# Patient Record
Sex: Male | Born: 1980 | Race: White | Hispanic: No | Marital: Married | Smoking: Current every day smoker
Health system: Southern US, Community
[De-identification: ages and names within clinical notes are randomized; demographics above are authoritative.]

## PROBLEM LIST (undated history)

## (undated) DIAGNOSIS — F1910 Other psychoactive substance abuse, uncomplicated: Secondary | ICD-10-CM

## (undated) DIAGNOSIS — Z9889 Other specified postprocedural states: Secondary | ICD-10-CM

## (undated) HISTORY — PX: TONSILLECTOMY: SUR1361

## (undated) HISTORY — PX: CORONARY ANGIOPLASTY WITH STENT PLACEMENT: SHX49

## (undated) HISTORY — PX: CHEST TUBE INSERTION: SHX231

---

## 2005-08-02 ENCOUNTER — Emergency Department: Payer: Self-pay | Admitting: Emergency Medicine

## 2005-08-31 ENCOUNTER — Emergency Department: Payer: Self-pay | Admitting: Emergency Medicine

## 2006-09-26 ENCOUNTER — Emergency Department: Payer: Self-pay | Admitting: Internal Medicine

## 2006-10-05 ENCOUNTER — Emergency Department: Payer: Self-pay | Admitting: Emergency Medicine

## 2007-08-30 ENCOUNTER — Emergency Department: Payer: Self-pay | Admitting: Emergency Medicine

## 2008-05-07 ENCOUNTER — Emergency Department: Payer: Self-pay | Admitting: Emergency Medicine

## 2011-01-14 ENCOUNTER — Emergency Department: Payer: Self-pay | Admitting: Emergency Medicine

## 2011-07-13 ENCOUNTER — Emergency Department: Payer: Self-pay | Admitting: *Deleted

## 2012-11-28 ENCOUNTER — Emergency Department: Payer: Self-pay | Admitting: Emergency Medicine

## 2014-06-03 ENCOUNTER — Emergency Department: Payer: Self-pay | Admitting: Emergency Medicine

## 2014-06-03 LAB — COMPREHENSIVE METABOLIC PANEL
ALK PHOS: 76 U/L
ALT: 104 U/L — AB (ref 12–78)
Albumin: 4.3 g/dL (ref 3.4–5.0)
Anion Gap: 4 — ABNORMAL LOW (ref 7–16)
BUN: 17 mg/dL (ref 7–18)
Bilirubin,Total: 0.4 mg/dL (ref 0.2–1.0)
CHLORIDE: 107 mmol/L (ref 98–107)
Calcium, Total: 8.6 mg/dL (ref 8.5–10.1)
Co2: 29 mmol/L (ref 21–32)
Creatinine: 1.25 mg/dL (ref 0.60–1.30)
EGFR (Non-African Amer.): >60
GLUCOSE: 97 mg/dL (ref 65–99)
OSMOLALITY: 281 (ref 275–301)
Potassium: 4.2 mmol/L (ref 3.5–5.1)
SGOT(AST): 56 U/L — ABNORMAL HIGH (ref 15–37)
Sodium: 140 mmol/L (ref 136–145)
TOTAL PROTEIN: 7.7 g/dL (ref 6.4–8.2)

## 2014-06-03 LAB — URINALYSIS, COMPLETE
Bacteria: NONE SEEN
Bilirubin,UR: NEGATIVE
Blood: NEGATIVE
GLUCOSE, UR: NEGATIVE mg/dL (ref 0–75)
Ketone: NEGATIVE
Leukocyte Esterase: NEGATIVE
Nitrite: NEGATIVE
PH: 5 (ref 4.5–8.0)
Protein: 30
RBC,UR: 4 /HPF (ref 0–5)
SPECIFIC GRAVITY: 1.028 (ref 1.003–1.030)
SQUAMOUS EPITHELIAL: NONE SEEN
WBC UR: 3 /HPF (ref 0–5)

## 2014-06-03 LAB — CBC
HCT: 46.4 % (ref 40.0–52.0)
HGB: 15.8 g/dL (ref 13.0–18.0)
MCH: 32.7 pg (ref 26.0–34.0)
MCHC: 34.1 g/dL (ref 32.0–36.0)
MCV: 96 fL (ref 80–100)
Platelet: 185 10*3/uL (ref 150–440)
RBC: 4.84 10*6/uL (ref 4.40–5.90)
RDW: 13.7 % (ref 11.5–14.5)
WBC: 6.3 10*3/uL (ref 3.8–10.6)

## 2014-06-03 LAB — DRUG SCREEN, URINE
Amphetamines, Ur Screen: NEGATIVE (ref ?–1000)
BENZODIAZEPINE, UR SCRN: POSITIVE (ref ?–200)
Barbiturates, Ur Screen: NEGATIVE (ref ?–200)
COCAINE METABOLITE, UR ~~LOC~~: POSITIVE (ref ?–300)
Cannabinoid 50 Ng, Ur ~~LOC~~: POSITIVE (ref ?–50)
MDMA (ECSTASY) UR SCREEN: NEGATIVE (ref ?–500)
Methadone, Ur Screen: NEGATIVE (ref ?–300)
Opiate, Ur Screen: NEGATIVE (ref ?–300)
Phencyclidine (PCP) Ur S: NEGATIVE (ref ?–25)
Tricyclic, Ur Screen: NEGATIVE (ref ?–1000)

## 2014-06-03 LAB — SALICYLATE LEVEL: SALICYLATES, SERUM: 2.6 mg/dL

## 2014-06-03 LAB — ETHANOL: Ethanol %: <0.003 % (ref 0.000–0.080)

## 2014-06-03 LAB — ACETAMINOPHEN LEVEL: Acetaminophen: <2 ug/mL

## 2014-07-16 ENCOUNTER — Emergency Department: Payer: Self-pay | Admitting: Student

## 2014-07-16 LAB — COMPREHENSIVE METABOLIC PANEL
ALK PHOS: 82 U/L
Albumin: 4 g/dL (ref 3.4–5.0)
Anion Gap: 7 (ref 7–16)
BUN: 10 mg/dL (ref 7–18)
Bilirubin,Total: 0.6 mg/dL (ref 0.2–1.0)
CALCIUM: 8.5 mg/dL (ref 8.5–10.1)
CHLORIDE: 105 mmol/L (ref 98–107)
Co2: 29 mmol/L (ref 21–32)
Creatinine: 0.9 mg/dL (ref 0.60–1.30)
EGFR (African American): >60
EGFR (Non-African Amer.): >60
GLUCOSE: 84 mg/dL (ref 65–99)
OSMOLALITY: 279 (ref 275–301)
Potassium: 3.8 mmol/L (ref 3.5–5.1)
SGOT(AST): 60 U/L — ABNORMAL HIGH (ref 15–37)
SGPT (ALT): 100 U/L — ABNORMAL HIGH
Sodium: 141 mmol/L (ref 136–145)
TOTAL PROTEIN: 7.4 g/dL (ref 6.4–8.2)

## 2014-07-16 LAB — CBC
HCT: 44.1 % (ref 40.0–52.0)
HGB: 15.5 g/dL (ref 13.0–18.0)
MCH: 33.3 pg (ref 26.0–34.0)
MCHC: 35.2 g/dL (ref 32.0–36.0)
MCV: 95 fL (ref 80–100)
PLATELETS: 161 10*3/uL (ref 150–440)
RBC: 4.66 10*6/uL (ref 4.40–5.90)
RDW: 12.9 % (ref 11.5–14.5)
WBC: 7.5 10*3/uL (ref 3.8–10.6)

## 2014-07-16 LAB — URINALYSIS, COMPLETE
Bacteria: NONE SEEN
Bilirubin,UR: NEGATIVE
Blood: NEGATIVE
Glucose,UR: NEGATIVE mg/dL (ref 0–75)
LEUKOCYTE ESTERASE: NEGATIVE
NITRITE: NEGATIVE
Ph: 5 (ref 4.5–8.0)
Protein: NEGATIVE
RBC,UR: 1 /HPF (ref 0–5)
Specific Gravity: 1.027 (ref 1.003–1.030)
Squamous Epithelial: NONE SEEN

## 2014-07-16 LAB — DRUG SCREEN, URINE
AMPHETAMINES, UR SCREEN: NEGATIVE (ref ?–1000)
BARBITURATES, UR SCREEN: NEGATIVE (ref ?–200)
BENZODIAZEPINE, UR SCRN: POSITIVE (ref ?–200)
Cannabinoid 50 Ng, Ur ~~LOC~~: POSITIVE (ref ?–50)
Cocaine Metabolite,Ur ~~LOC~~: POSITIVE (ref ?–300)
MDMA (Ecstasy)Ur Screen: NEGATIVE (ref ?–500)
Methadone, Ur Screen: NEGATIVE (ref ?–300)
OPIATE, UR SCREEN: POSITIVE (ref ?–300)
Phencyclidine (PCP) Ur S: NEGATIVE (ref ?–25)
Tricyclic, Ur Screen: NEGATIVE (ref ?–1000)

## 2014-07-16 LAB — SALICYLATE LEVEL: SALICYLATES, SERUM: 3.2 mg/dL — AB

## 2014-07-16 LAB — ACETAMINOPHEN LEVEL: Acetaminophen: <2 ug/mL

## 2014-07-16 LAB — ETHANOL

## 2016-01-21 ENCOUNTER — Emergency Department
Admission: EM | Admit: 2016-01-21 | Discharge: 2016-01-21 | Disposition: A | Discharge status: Home / Self Care | Payer: Self-pay

## 2016-01-22 ENCOUNTER — Emergency Department
Admission: EM | Admit: 2016-01-22 | Discharge: 2016-01-22 | Disposition: A | Discharge status: Home / Self Care | Payer: Self-pay | Attending: Emergency Medicine | Admitting: Emergency Medicine

## 2016-01-22 ENCOUNTER — Emergency Department: Payer: Self-pay

## 2016-01-22 DIAGNOSIS — R200 Anesthesia of skin: Secondary | ICD-10-CM | POA: Insufficient documentation

## 2016-01-22 DIAGNOSIS — F1410 Cocaine abuse, uncomplicated: Secondary | ICD-10-CM | POA: Insufficient documentation

## 2016-01-22 DIAGNOSIS — M25512 Pain in left shoulder: Secondary | ICD-10-CM | POA: Insufficient documentation

## 2016-01-22 DIAGNOSIS — F1210 Cannabis abuse, uncomplicated: Secondary | ICD-10-CM | POA: Insufficient documentation

## 2016-01-22 DIAGNOSIS — F17210 Nicotine dependence, cigarettes, uncomplicated: Secondary | ICD-10-CM | POA: Insufficient documentation

## 2016-01-22 DIAGNOSIS — F1010 Alcohol abuse, uncomplicated: Secondary | ICD-10-CM | POA: Insufficient documentation

## 2016-01-22 DIAGNOSIS — F1110 Opioid abuse, uncomplicated: Secondary | ICD-10-CM | POA: Insufficient documentation

## 2016-01-22 DIAGNOSIS — F1910 Other psychoactive substance abuse, uncomplicated: Secondary | ICD-10-CM

## 2016-01-22 DIAGNOSIS — R079 Chest pain, unspecified: Secondary | ICD-10-CM | POA: Insufficient documentation

## 2016-01-22 LAB — BASIC METABOLIC PANEL
ANION GAP: 9 (ref 5–15)
BUN: 14 mg/dL (ref 6–20)
CHLORIDE: 103 mmol/L (ref 101–111)
CO2: 25 mmol/L (ref 22–32)
Calcium: 9.3 mg/dL (ref 8.9–10.3)
Creatinine, Ser: 0.83 mg/dL (ref 0.61–1.24)
GFR calc non Af Amer: >60 mL/min (ref >60)
Glucose, Bld: 114 mg/dL — ABNORMAL HIGH (ref 65–99)
POTASSIUM: 3.6 mmol/L (ref 3.5–5.1)
Sodium: 137 mmol/L (ref 135–145)

## 2016-01-22 LAB — CBC
HEMATOCRIT: 40 % (ref 40.0–52.0)
HEMOGLOBIN: 14.2 g/dL (ref 13.0–18.0)
MCH: 32 pg (ref 26.0–34.0)
MCHC: 35.4 g/dL (ref 32.0–36.0)
MCV: 90.3 fL (ref 80.0–100.0)
Platelets: 161 10*3/uL (ref 150–440)
RBC: 4.44 MIL/uL (ref 4.40–5.90)
RDW: 12.8 % (ref 11.5–14.5)
WBC: 9.4 10*3/uL (ref 3.8–10.6)

## 2016-01-22 LAB — TROPONIN I: Troponin I: <0.03 ng/mL (ref <0.031)

## 2016-01-22 LAB — FIBRIN DERIVATIVES D-DIMER (ARMC ONLY): Fibrin derivatives D-dimer (ARMC): 253 (ref 0–499)

## 2016-01-22 MED ORDER — DIAZEPAM 5 MG PO TABS
5.0000 mg | ORAL_TABLET | Freq: Once | ORAL | Status: AC
Start: 2016-01-22 — End: 2016-01-22
  Administered 2016-01-22: 5 mg via ORAL
  Filled 2016-01-22: qty 1

## 2016-01-22 MED ORDER — SODIUM CHLORIDE 0.9 % IV BOLUS (SEPSIS)
1000.0000 mL | Freq: Once | INTRAVENOUS | Status: AC
  Administered 2016-01-22: 1000 mL via INTRAVENOUS

## 2016-01-22 MED ORDER — KETOROLAC TROMETHAMINE 30 MG/ML IJ SOLN
30.0000 mg | Freq: Once | INTRAMUSCULAR | Status: AC
  Administered 2016-01-22: 30 mg via INTRAVENOUS
  Filled 2016-01-22: qty 1

## 2016-01-22 NOTE — ED Notes (Signed)
MD Quigley at bedside. 

## 2016-01-22 NOTE — ED Notes (Signed)
Pt requested meal tray. MD Huel CoteQuigley notified. MD approved meal. Food/water brought to pt.

## 2016-01-22 NOTE — ED Notes (Signed)
Pt O2 sats decreased to 87. Placed pt on 2L [place]. Pt currently O2 sat of 97 on 2L.

## 2016-01-22 NOTE — ED Notes (Signed)
Res[facility] care from [facility]. Pt requests more pain medication and removal of IV. Told pt we will ask the MD for more medication and asked that he bear with us a bit longer on the IV until we are sure he is medically cleared. He agreed. Pt states he would like to go directly from here to rehab.

## 2016-01-22 NOTE — ED Notes (Signed)
Pt called [facility] and there is a spot. They will pick him up upon discharge

## 2016-01-22 NOTE — Discharge Instructions (Signed)
Polysubstance Abuse °When people abuse more than one drug or type of drug it is called polysubstance or polydrug abuse. For example, many smokers also drink alcohol. This is one form of polydrug abuse. Polydrug abuse also refers to the use of a drug to counteract an unpleasant effect produced by another drug. It may also be used to help with withdrawal from another drug. People who take stimulants may become agitated. Sometimes this agitation is countered with a tranquilizer. This helps protect against the unpleasant side effects. Polydrug abuse also refers to the use of different drugs at the same time.  °Anytime drug use is interfering with normal living activities, it has become abuse. This includes problems with family and friends. Psychological dependence has developed when your mind tells you that the drug is needed. This is usually followed by physical dependence which has developed when continuing increases of drug are required to get the same feeling or "high". This is known as addiction or chemical dependency. A person's risk is much higher if there is a history of chemical dependency in the family. °SIGNS OF CHEMICAL DEPENDENCY °· You have been told by friends or family that drugs have become a problem. °· You fight when using drugs. °· You are having blackouts (not remembering what you do while using). °· You feel sick from using drugs but continue using. °· You lie about use or amounts of drugs (chemicals) used. °· You need chemicals to get you going. °· You are suffering in work performance or in school because of drug use. °· You get sick from use of drugs but continue to use anyway. °· You need drugs to relate to people or feel comfortable in social situations. °· You use drugs to forget problems. °"Yes" answered to any of the above signs of chemical dependency indicates there are problems. The longer the use of drugs continues, the greater the problems will become. °If there is a family history of  drug or alcohol use, it is best not to experiment with these drugs. Continual use leads to tolerance. After tolerance develops more of the drug is needed to get the same feeling. This is followed by addiction. With addiction, drugs become the most important part of life. It becomes more important to take drugs than participate in the other usual activities of life. This includes relating to friends and family. Addiction is followed by dependency. Dependency is a condition where drugs are now needed not just to get high, but to feel normal. °Addiction cannot be cured but it can be stopped. This often requires outside help and the care of professionals. Treatment centers are listed in the yellow pages under: Cocaine, Narcotics, and Alcoholics Anonymous. Most hospitals and clinics can refer you to a specialized care center. Talk to your caregiver if you need help. °  °This information is not intended to replace advice given to you by your health care provider. Make sure you discuss any questions you have with your health care provider. °  °Document Released: 06/29/2005 Document Revised: 01/30/2012 Document Reviewed: 11/12/2014 °Elsevier Interactive Patient Education ©2016 [employer] ° °

## 2016-01-22 NOTE — ED Notes (Signed)
Pt discharged home after verbalizing understanding of discharge instructions; nad noted. 

## 2016-01-22 NOTE — B[facility] Counselor (Addendum)
With patient's consent, faxed referral information to [facility] for detox services.  [facility] will provide pt with resources for detox as [facility] not provide these services.

## 2016-01-22 NOTE — ED Notes (Signed)
Pt states: "I haven't been doing nothing but drugs for the last 3 days". Pt reports he injected heroine into both AC's tonight. Pt reports he lives with his girlfriend, but she is currently in rehab.

## 2016-01-22 NOTE — ED Provider Notes (Signed)
Time Seen: Approximately 0 500  I have reviewed the triage notes  Chief Complaint: Chest Pain   History of Present Illness: Gary Miranda is a 35 y.o. male who presents with acute onset of left-sided chest discomfort with some left shoulder pain. Patient admits to polysubstance abuse and states he injected himself with some heroin in both arms earlier this morning. He states he was on the floor for an unknown period of time and woke up with the left-sided chest discomfort and numbness. He denies any exacerbating or relieving factors for his chest pain. Patient admits to cocaine usage yesterday. He also admits to alcohol consumption on a daily basis He also periodically use Percocet, marijuana, etc. He does smoke cigarettes. No history of diabetes, hypertension, high cholesterol. History reviewed. No pertinent past medical history.  There are no active problems to display for this patient.   History reviewed. No pertinent past surgical history.  History reviewed. No pertinent past surgical history.  No current outpatient prescriptions on file.  Allergies:  Review of patient's allergies indicates no known allergies.  Family History: History reviewed. No pertinent family history. He states his father had heart disease at a later age than himself Social History: Social History  Substance Use Topics  . Smoking status: Current Every Day Smoker -- 1.00 packs/day  . Smokeless tobacco: None  . Alcohol Use: Yes     Comment: Pt states "I drink at least a fifth every other day"     Review of Systems:   10 point review of systems was performed and was otherwise negative:  Constitutional: No fever Eyes: No visual disturbances ENT: No sore throat, ear pain Cardiac: Chest pain is constant left-sided toward the sternal area and up in the left upper chest region he describes it as a sharp discomfort Respiratory: No shortness of breath, wheezing, or stridor Abdomen: No abdominal pain, no  vomiting, No diarrhea Endocrine: No weight loss, No night sweats Extremities: No peripheral edema, cyanosis Skin: No rashes, easy bruising Neurologic: No focal weakness, trouble with speech or swollowing Urologic: No dysuria, Hematuria, or urinary frequency   Physical Exam:  ED Triage Vitals  Enc Vitals Group     BP 01/22/16 0436 126/80 mmHg     Pulse Rate 01/22/16 0436 70     Resp 01/22/16 0436 24     Temp 01/22/16 0436 98.1 F (36.7 C)     Temp Source 01/22/16 0436 Oral     SpO2 01/22/16 0428 100 %     Weight 01/22/16 0436 145 lb (65.772 kg)     Height 01/22/16 0436  (1.676 m)     Head Cir --      Peak Flow --      Pain Score 01/22/16 0437 4     Pain Loc --      Pain Edu? --      Excl. in GC? --     General: Awake , Alert , and Oriented times 3; GCS 15 Head: Normal cephalic , atraumatic Eyes: Pupils equal , round, reactive to light Nose/Throat: No nasal drainage, patent upper airway without erythema or exudate.  Neck: Supple, Full range of motion, No anterior adenopathy or palpable thyroid masses Lungs: Clear to ascultation without wheezes , rhonchi, or rales Heart: Regular rate, regular rhythm without murmurs , gallops , or rubs Abdomen: Soft, non tender without rebound, guarding , or rigidity; bowel sounds positive and symmetric in all 4 quadrants. No organomegaly .  Extremities: 2 plus symmetric pulses. No edema, clubbing or cyanosis Neurologic: normal ambulation, Motor symmetric without deficits, sensory intact Skin: warm, dry, no rashes   Labs:   All laboratory work was reviewed including any pertinent negatives or positives listed below:  Labs Reviewed  BASIC METABOLIC PANEL  CBC  TROPONIN I  FIBRIN DERIVATIVES D-DIMER ([facility] ONLY)   review the patient's laboratory work shows no significant findings  EKG:  ED ECG REPORT I, Jennye MoccasinBrian S Lenah Messenger, the attending physician, personally viewed and interpreted this ECG.  Date: 01/22/2016 EKG Time:  0447 Rate: 66 Rhythm: normal sinus rhythm QRS Axis: normal Intervals: normal ST/T Wave abnormalities: normal Conduction Disturbances: none Narrative Interpretation: unremarkable Normal EKG  Radiology:     dizziness and nausea.  EXAM: CHEST 2 VIEW  COMPARISON: Chest radiograph June 03, 2014  FINDINGS: Cardiomediastinal silhouette is normal. Linear density RIGHT upper lobe. The lungs are clear without pleural effusions or focal consolidations. Trachea projects midline and there is no pneumothorax. Soft tissue planes and included osseous structures are non-suspicious.  IMPRESSION: RIGHT upper lobe atelectasis/scarring.   Electronically Signed By: Awilda Metroourtnay Bloomer M.D. On: 01/22/2016 05:32  I personally reviewed the radiologic studies     ED Course:  Patient's stay here was uneventful and remains hemodynamically stable. Not sure of the source of his chest pain but I don't suspect is from a life-threatening etiology. Patient appears stable and is requesting drug and alcohol treatment program. Patient's currently medically cleared and I have added a urine drug screen and alcohol levels. Consult has been established with [facility]    Assessment:  Acute unspecified chest pain Polysubstance abuse     Plan:  TTS consultation           Jennye MoccasinBrian S Batoul Limes, MD 01/22/16 [phone]

## 2016-01-22 NOTE — ED Notes (Signed)
Pt reports hx of IV cocaine and heroine use. Pt reports taking IV cocaine today and having left chest pains immediately afterwards rated at a 6 out of 10. Pt c/o of dizziness, nausea. Pt reports he would like help detoxing.

## 2016-01-22 NOTE — ED Provider Notes (Signed)
Patient is no acute distress, medically clear for detox.  Gary FilbertJonathan E Williams, MD 01/22/16 [phone]

## 2017-11-04 ENCOUNTER — Other Ambulatory Visit: Payer: Self-pay

## 2017-11-04 ENCOUNTER — Encounter: Payer: Self-pay | Admitting: Emergency Medicine

## 2017-11-04 ENCOUNTER — Emergency Department
Admission: EM | Admit: 2017-11-04 | Discharge: 2017-11-04 | Disposition: A | Discharge status: Home / Self Care | Payer: Self-pay | Attending: Emergency Medicine | Admitting: Emergency Medicine

## 2017-11-04 DIAGNOSIS — K0889 Other specified disorders of teeth and supporting structures: Secondary | ICD-10-CM | POA: Insufficient documentation

## 2017-11-04 DIAGNOSIS — F17210 Nicotine dependence, cigarettes, uncomplicated: Secondary | ICD-10-CM | POA: Insufficient documentation

## 2017-11-04 MED ORDER — IBUPROFEN 800 MG PO TABS: 800.0000 mg | ORAL_TABLET | Freq: Three times a day (TID) | ORAL | 0 refills | Status: AC | PRN

## 2017-11-04 MED ORDER — TRAMADOL HCL 50 MG PO TABS
50.0000 mg | ORAL_TABLET | Freq: Once | ORAL | Status: AC
  Administered 2017-11-04: 50 mg via ORAL
  Filled 2017-11-04: qty 1

## 2017-11-04 MED ORDER — IBUPROFEN 800 MG PO TABS
800.0000 mg | ORAL_TABLET | Freq: Once | ORAL | Status: AC
  Administered 2017-11-04: 800 mg via ORAL
  Filled 2017-11-04: qty 1

## 2017-11-04 MED ORDER — TRAMADOL HCL 50 MG PO TABS: 50.0000 mg | ORAL_TABLET | Freq: Four times a day (QID) | ORAL | 0 refills | Status: DC | PRN

## 2017-11-04 MED ORDER — CLINDAMYCIN HCL 150 MG PO CAPS: 150.0000 mg | ORAL_CAPSULE | Freq: Four times a day (QID) | ORAL | 0 refills | Status: DC

## 2017-11-04 NOTE — ED Provider Notes (Signed)
[facility] Emergency Department Provider Note   ____________________________________________   First MD Initiated Contact with Patient 11/04/17 1301     (approximate)  I have reviewed the triage vital signs and the nursing notes.   HISTORY  Chief Complaint Dental Pain    HPI Gary Miranda is a 36 y.o. male patient presented with left lower molar dental pain. Patient has to devitalize teeth in the left lower molar area. Patient state pain increase since yesterday.Patient rates pain as 8/10. Patient describes pain as "achy". No palliative measures for complaint.   History reviewed. No pertinent past medical history.  There are no active problems to display for this patient.   History reviewed. No pertinent surgical history.  Prior to Admission medications   Medication Sig Start Date End Date Taking? Authorizing Provider  clindamycin (CLEOCIN) 150 MG capsule Take 1 capsule (150 mg total) by mouth 4 (four) times daily. 11/04/17   Joni ReiningSmith, Ronald K, PA-C  ibuprofen (ADVIL,MOTRIN) 800 MG tablet Take 1 tablet (800 mg total) by mouth every 8 (eight) hours as needed for moderate pain. 11/04/17   Joni ReiningSmith, Ronald K, PA-C  traMADol (ULTRAM) 50 MG tablet Take 1 tablet (50 mg total) by mouth every 6 (six) hours as needed for moderate pain. 11/04/17   Joni ReiningSmith, Ronald K, PA-C    Allergies Penicillins  History reviewed. No pertinent family history.  Social History Social History   Tobacco Use  . Smoking status: Current Every Day Smoker    Packs/day: 1.00  . Smokeless tobacco: Never Used  Substance Use Topics  . Alcohol use: Yes    Comment: Pt states "I drink at least a fifth every other day"  . Drug use: Yes    Types: Cocaine, IV    Comment: heroine; percocet, oxycodone    Review of Systems Constitutional: No fever/chills Eyes: No visual changes. ENT: No sore throat. Didn't. Cardiovascular: Denies chest pain. Respiratory: Denies shortness of  breath. Gastrointestinal: No abdominal pain.  No nausea, no vomiting.  No diarrhea.  No constipation. Genitourinary: Negative for dysuria. Musculoskeletal: Negative for back pain. Skin: Negative for rash. Neurological: Negative for headaches, focal weakness or numbness. Psychiatric:Substance abuse Allergic/Immunilogical: Penicillin ____________________________________________   PHYSICAL EXAM:  VITAL SIGNS: ED Triage Vitals  Enc Vitals Group     BP 11/04/17 1237 132/86     Pulse Rate 11/04/17 1237 90     Resp 11/04/17 1237 16     Temp 11/04/17 1237 98.9 F (37.2 C)     Temp Source 11/04/17 1237 Oral     SpO2 11/04/17 1237 100 %     Weight 11/04/17 1238 155 lb (70.3 kg)     Height 11/04/17 1238 5\' 6"  (1.676 m)     Head Circumference --      Peak Flow --      Pain Score 11/04/17 1237 8     Pain Loc --      Pain Edu? --      Excl. in GC? --    Constitutional: Alert and oriented. Well appearing and in no acute distress. Mouth/Throat: Mucous membranes are moist.  Oropharynx non-erythematous. Devitalize tooth #18 and 19 with moderate gingival edema Neck: No stridor.   Cardiovascular: Normal rate, regular rhythm. Grossly normal heart sounds.  Good peripheral circulation. Respiratory: Normal respiratory effort.  No retractions. Lungs CTAB. Neurologic:  Normal speech and language. No gross focal neurologic deficits are appreciated. No gait instability. Skin:  Skin is warm, dry and intact. No rash  noted. Psychiatric: Mood and affect are normal. Speech and behavior are normal.  ____________________________________________   LABS (all labs ordered are listed, but only abnormal results are displayed)  Labs Reviewed - No data to display ____________________________________________  EKG   ____________________________________________  RADIOLOGY  No results found.  ____________________________________________   PROCEDURES  Procedure(s) performed:  None  Procedures  Critical Care performed: No  ____________________________________________   INITIAL IMPRESSION / ASSESSMENT AND PLAN / ED COURSE  As part of my medical decision making, I reviewed the following data within the electronic MEDICAL RECORD NUMBER    Dental pain secondary to fractured devitalize teeth. Patient given discharge care instruction list of dental clinic for follow-up care.      ____________________________________________   FINAL CLINICAL IMPRESSION(S) / ED DIAGNOSES  Final diagnoses:  Pain, dental     ED Discharge Orders        Ordered    clindamycin (CLEOCIN) 150 MG capsule  4 times daily     11/04/17 1307    traMADol (ULTRAM) 50 MG tablet  Every 6 hours PRN     11/04/17 1307    ibuprofen (ADVIL,MOTRIN) 800 MG tablet  Every 8 hours PRN     11/04/17 1307       Note:  This document was prepared using Dragon voice recognition software and may include unintentional dictation errors.    Joni ReiningSmith, Ronald K, PA-C 11/04/17 1314    Emily FilbertWilliams, Jonathan E, MD 11/04/17 [phone]

## 2017-11-04 NOTE — ED Notes (Signed)
Pt ambulatory upon discharge. Pt thankful to Ron, PA-C for providing free or low-cost healthcare information for [place] and for providing area dental clinic info. Pt reports 8/10 pain but just received pain medication. Significant other is driving home. Pt and significant other verbalized understanding of discharge instructions, prescriptions and importance of follow-up care. Skin warm and dry. A&O x4.

## 2017-11-04 NOTE — ED Triage Notes (Signed)
Pt reports dental pain on the left side, states he has 2 teeth next to each other that are bad. Pain and discomfort since yesterday morning, worse today.

## 2017-11-04 NOTE — Discharge Instructions (Signed)
Follow-up from list of dental clinics. °OPTIONS FOR DENTAL FOLLOW UP CARE ° °[place] [facility] and [facility] - Local Safety Net Dental Clinics °http://www.ncdhhs.gov/dph/oralhealth/services/safetynetclinics.htm °  °[facility] ([phone]) ° °[facility] [place] ([phone]) ° °[facility] ([phone] ext [phone]) ° °[facility] ([phone]) ° °[facility] ([phone]) °This clinic caters to the indigent population and is on a lottery system. °Location: °[facility], [address], [place] °Clinic Hours: °Wednesdays from 6pm - 9pm, patients seen by a lottery system. °For dates, call or go to [email or web address]/[facility]/[email or web address] °Services: °Cleanings, fillings and simple extractions. °Payment Options: °DENTAL WORK IS FREE OF CHARGE. Bring proof of income or support. °Best way to get seen: °Arrive at 5:15 pm - this is a lottery, NOT first come/first serve, so arriving earlier will not increase your chances of being seen. °  °  °[facility] °[phone] °Select option 1 for emergencies °  °Location: °[facility], [address], [place] °Clinic Hours: °No walk-ins accepted - call the day before to schedule an appointment. °Check in times are 9:30 am and 1:30 pm. °Services: °Simple extractions, temporary fillings, pulpectomy/pulp debridement, uncomplicated abscess drainage. °Payment Options: °PAYMENT IS DUE AT THE TIME OF SERVICE.  Fee is usually $100-200, additional surgical procedures (e.g. abscess drainage) may be extra. °Cash, checks, Visa/MasterCard accepted.  Can file Medicaid if patient is covered for dental - patient should call case worker to check. °No discount for [facility] patients. °Best way to get seen: °MUST call the day before and get onto the schedule. Can usually be seen the next 1-2 days. No walk-ins accepted. °  °  °[facility] °[phone] °  °Location: °[facility], [address], [place] °Clinic Hours: °M, W, Th, F 8am or 1:30pm, Tues 9a or 1:30 - first come/first served. °Services: °Simple extractions, temporary fillings, uncomplicated abscess drainage.  You do not need to be an [place] resident. °Payment Options: °PAYMENT IS DUE AT THE TIME OF SERVICE. °Dental insurance, otherwise sliding scale - bring proof of income or support. °Depending on income and treatment needed, cost is usually $50-200. °Best way to get seen: °Arrive early as it is first come/first served. °  °  °[facility] °[phone] °  °Location: °[address] °Clinic Hours: °Mon-Thu 8a-5p °Services: °Most basic dental services including extractions and fillings. °Payment Options: °PAYMENT IS DUE AT THE TIME OF SERVICE. °Sliding scale, up to 50% off - bring proof if income or support. °Medicaid with dental option accepted. °Best way to get seen: °Call to schedule an appointment, can usually be seen within 2 weeks OR they will try to see walk-ins - show up at 8a or 2p (you may have to wait). °  °  °[facility] °[phone] °[place] RESIDENTS ONLY °  °Location: °[facility], [address], [place], [place] [address] °Clinic Hours: By appointment only. °Monday - Thursday 8am-5pm, Friday 8am-12pm °Services: Cleanings, fillings, extractions. °Payment Options: °PAYMENT IS DUE AT THE TIME OF SERVICE. °Cash, Visa or MasterCard. Sliding scale - $30 minimum per service. °Best way to get seen: °Come in to office, complete packet and make an appointment - need proof of income °or support monies for each household member and proof of [place] residence. °Usually takes about a month to get in. °  °  °[facility] °[phone] °  °  Location: [address], [place] Clinic Hours: Walk-in Urgent Care Dental Services  are offered Monday-Friday mornings only. The numbers of emergencies accepted daily is limited to the number of providers available. Maximum 15 - Mondays, Wednesdays & Thursdays Maximum 10 - Tuesdays & Fridays Services: You do not need to be a [facility] resident to be seen for a dental emergency. Emergencies are defined as pain, swelling, abnormal bleeding, or dental trauma. Walkins will receive x-rays if needed. NOTE: Dental cleaning is not an emergency. Payment Options: PAYMENT IS DUE AT THE TIME OF SERVICE. Minimum co-pay is $40.00 for uninsured patients. Minimum co-pay is $3.00 for Medicaid with dental coverage. Dental Insurance is accepted and must be presented at time of visit. Medicare does not cover dental. Forms of payment: Cash, credit card, checks. Best way to get seen: If not previously registered with the clinic, walk-in dental registration begins at 7:15 am and is on a first come/first serve basis. If previously registered with the clinic, call to make an appointment.     [facility] [place] ONLY   Location: [address], [place], [place] Clinic Hours: Mon-Thu 10a-2p Services: Extractions only! Payment Options: FREE (donations accepted) - bring proof of income or support Best way to get seen: Call and schedule an appointment OR come at 8am on the 1st Monday of every month (except for holidays) when it is first come/first served.     [facility] [phone]   Location: [place], [place] Clinic Hours: Friday mornings Services, Payment Options, Best way to get seen: Call for info

## 2017-11-24 ENCOUNTER — Other Ambulatory Visit: Payer: Self-pay

## 2017-11-24 ENCOUNTER — Encounter: Payer: Self-pay | Admitting: Emergency Medicine

## 2017-11-24 ENCOUNTER — Emergency Department
Admission: EM | Admit: 2017-11-24 | Discharge: 2017-11-24 | Disposition: A | Discharge status: Home / Self Care | Payer: Self-pay | Attending: Emergency Medicine | Admitting: Emergency Medicine

## 2017-11-24 DIAGNOSIS — Z79899 Other long term (current) drug therapy: Secondary | ICD-10-CM | POA: Insufficient documentation

## 2017-11-24 DIAGNOSIS — H9201 Otalgia, right ear: Secondary | ICD-10-CM

## 2017-11-24 DIAGNOSIS — H7292 Unspecified perforation of tympanic membrane, left ear: Secondary | ICD-10-CM

## 2017-11-24 DIAGNOSIS — F17210 Nicotine dependence, cigarettes, uncomplicated: Secondary | ICD-10-CM | POA: Insufficient documentation

## 2017-11-24 DIAGNOSIS — H66012 Acute suppurative otitis media with spontaneous rupture of ear drum, left ear: Secondary | ICD-10-CM | POA: Insufficient documentation

## 2017-11-24 MED ORDER — NAPROXEN 500 MG PO TABS: 500.0000 mg | ORAL_TABLET | Freq: Two times a day (BID) | ORAL | 0 refills | Status: DC

## 2017-11-24 MED ORDER — DOXYCYCLINE HYCLATE 100 MG PO CAPS: 100.0000 mg | ORAL_CAPSULE | Freq: Two times a day (BID) | ORAL | 0 refills | Status: DC

## 2017-11-24 MED ORDER — TRAMADOL HCL 50 MG PO TABS: 50.0000 mg | ORAL_TABLET | Freq: Four times a day (QID) | ORAL | 0 refills | Status: DC | PRN

## 2017-11-24 MED ORDER — CIPROFLOXACIN-DEXAMETHASONE 0.3-0.1 % OT SUSP
4.0000 [drp] | Freq: Once | OTIC | Status: AC
  Administered 2017-11-24: 4 [drp] via OTIC
  Filled 2017-11-24: qty 7.5

## 2017-11-24 NOTE — ED Provider Notes (Signed)
[facility] Emergency Department Provider Note ____________________________________________  Time seen: Approximately 4:06 PM  I have reviewed the triage vital signs and the nursing notes.   HISTORY  Chief Complaint Otalgia    HPI Gary Miranda is a 37 y.o. male who presents to the emergency department for evaluation of of left earache and bleeding. He has had a URI for the past several days. Bloody drainage, decrease in hearing, and pain started last night. No relief with OTC medications.   History reviewed. No pertinent past medical history.  There are no active problems to display for this patient.   History reviewed. No pertinent surgical history.  Prior to Admission medications   Medication Sig Start Date End Date Taking? Authorizing Provider  clindamycin (CLEOCIN) 150 MG capsule Take 1 capsule (150 mg total) by mouth 4 (four) times daily. 11/04/17   Joni ReiningSmith, Ronald K, PA-C  doxycycline (VIBRAMYCIN) 100 MG capsule Take 1 capsule (100 mg total) by mouth 2 (two) times daily. 11/24/17   Aireonna Bauer, Rulon Eisenmengerari B, FNP  ibuprofen (ADVIL,MOTRIN) 800 MG tablet Take 1 tablet (800 mg total) by mouth every 8 (eight) hours as needed for moderate pain. 11/04/17   Joni ReiningSmith, Ronald K, PA-C  naproxen (NAPROSYN) 500 MG tablet Take 1 tablet (500 mg total) by mouth 2 (two) times daily with a meal. 11/24/17   Caeson Filippi B, FNP  traMADol (ULTRAM) 50 MG tablet Take 1 tablet (50 mg total) by mouth every 6 (six) hours as needed. 11/24/17   Chinita Pesterriplett, Chloeann Alfred B, FNP    Allergies Penicillins  History reviewed. No pertinent family history.  Social History Social History   Tobacco Use  . Smoking status: Current Every Day Smoker    Packs/day: 1.00  . Smokeless tobacco: Never Used  Substance Use Topics  . Alcohol use: Yes    Comment: Pt states "I drink at least a fifth every other day"  . Drug use: Yes    Types: Cocaine, IV    Comment: heroine; percocet, oxycodone    Review of  Systems Constitutional: Negative for fever. Positive for decreased ability to hear from left ear(s). Eyes: Positive for discharge or drainage. ENT:       Positive for otalgia in left ear(s).      Positive for rhinorrhea or congestion.      Negative for sore throat. Gastrointestinal: Negative for nausea, vomiting, or diarrhea. Musculoskeletal: Negative for myalgias. Skin: Negative for rash, lesions, or wounds. Neurological: Negative for paresthesias. ____________________________________________   PHYSICAL EXAM:  VITAL SIGNS: ED Triage Vitals  Enc Vitals Group     BP 11/24/17 0911 139/76     Pulse Rate 11/24/17 0911 77     Resp 11/24/17 0911 18     Temp 11/24/17 0911 98.8 F (37.1 C)     Temp Source 11/24/17 0911 Oral     SpO2 11/24/17 0911 100 %     Weight 11/24/17 0908 155 lb (70.3 kg)     Height 11/24/17 0908 5\' 6"  (1.676 m)     Head Circumference --      Peak Flow --      Pain Score 11/24/17 0908 9     Pain Loc --      Pain Edu? --      Excl. in GC? --     Constitutional: Acutely ill appearing. Eyes: Conjunctivae are clear without discharge or drainage. Ears:       Right TM appears intact and without erythema or drainage.  Left TM appears Dull, red, punctate disruption in TM at about the 6 o'clock position. Head: Atraumatic. Nose: No rhinorrhea or sinus pain on percussion. Mouth/Throat: Oropharynx appears normal. Tonsils not visualized. Hematological/Lymphatic/Immunilogical: No palpable anterior cervical lymphadenopathy. Cardiovascular: Heart rate and rhythm are regular without murmur, gallop, or rub appreciated. Respiratory: Breath sounds are clear throughout to auscultation.  Neurologic:  Alert and oriented x 4. Skin: Intact and without rash, lesion, or wound on exposed skin surfaces. ____________________________________________   LABS (all labs ordered are listed, but only abnormal results are displayed)  Labs Reviewed - No data to  display ____________________________________________   RADIOLOGY  Not indicated. ____________________________________________   PROCEDURES  Procedure(s) performed: None   ____________________________________________   INITIAL IMPRESSION / ASSESSMENT AND PLAN / ED COURSE  37 year old male with a perforated TM. He was given Ciprodex while in the ER and will fill prescription for doxycycline as he is PCN allergic. He is to follow up with the ENT specialist for symptoms that are not improving over the next few days. He is to return to the ER for symptoms that change or worsen if unable to schedule an appointment.  Pertinent labs & imaging results that were available during my care of the patient were reviewed by me and considered in my medical decision making (see chart for details). ____________________________________________   FINAL CLINICAL IMPRESSION(S) / ED DIAGNOSES  Final diagnoses:  Tympanic membrane rupture, left  Acute suppurative otitis media of left ear with spontaneous rupture of tympanic membrane, recurrence not specified  Otalgia of right ear    This SmartLink is deprecated. Use AVSMEDLIST instead to display the medication list for a patient.  If controlled substance prescribed during this visit, 12 month history viewed on the [facility] prior to issuing an initial prescription for Schedule II or III opiod.   Note:  This document was prepared using Dragon voice recognition software and may include unintentional dictation errors.     Chinita Pester, FNP 11/24/17 1611    Myrna Blazer, MD 11/25/17 1025

## 2017-11-24 NOTE — ED Notes (Signed)
See triage note.  Presents with left ear pain   States pain started yesterday  But also has had subjective fever,cough and body aches  This am he noticed some blood coming from ear

## 2017-11-24 NOTE — ED Triage Notes (Addendum)
C/o left otalgia with drainage since last night. Denies injury.  Ambulatory without difficulty. No head injury

## 2018-01-22 ENCOUNTER — Other Ambulatory Visit: Payer: Self-pay

## 2018-01-22 ENCOUNTER — Emergency Department: Payer: Self-pay

## 2018-01-22 ENCOUNTER — Emergency Department
Admission: EM | Admit: 2018-01-22 | Discharge: 2018-01-22 | Disposition: A | Discharge status: Home / Self Care | Payer: Self-pay | Attending: Student in an Organized Health Care Education/Training Program | Admitting: Student in an Organized Health Care Education/Training Program

## 2018-01-22 DIAGNOSIS — R932 Abnormal findings on diagnostic imaging of liver and biliary tract: Secondary | ICD-10-CM | POA: Insufficient documentation

## 2018-01-22 DIAGNOSIS — N201 Calculus of ureter: Secondary | ICD-10-CM | POA: Insufficient documentation

## 2018-01-22 DIAGNOSIS — F17210 Nicotine dependence, cigarettes, uncomplicated: Secondary | ICD-10-CM | POA: Insufficient documentation

## 2018-01-22 DIAGNOSIS — Z79899 Other long term (current) drug therapy: Secondary | ICD-10-CM | POA: Insufficient documentation

## 2018-01-22 DIAGNOSIS — R109 Unspecified abdominal pain: Secondary | ICD-10-CM

## 2018-01-22 LAB — URINALYSIS, COMPLETE (UACMP) WITH MICROSCOPIC
Bacteria, UA: NONE SEEN
Bilirubin Urine: NEGATIVE
GLUCOSE, UA: NEGATIVE mg/dL
KETONES UR: NEGATIVE mg/dL
LEUKOCYTES UA: NEGATIVE
Nitrite: NEGATIVE
PROTEIN: 30 mg/dL — AB
Specific Gravity, Urine: 1.018 (ref 1.005–1.030)
pH: 5 (ref 5.0–8.0)

## 2018-01-22 LAB — COMPREHENSIVE METABOLIC PANEL
ALBUMIN: 4.3 g/dL (ref 3.5–5.0)
ALK PHOS: 75 U/L (ref 38–126)
ALT: 47 U/L (ref 17–63)
ANION GAP: 8 (ref 5–15)
AST: 35 U/L (ref 15–41)
BUN: 13 mg/dL (ref 6–20)
CALCIUM: 9 mg/dL (ref 8.9–10.3)
CHLORIDE: 105 mmol/L (ref 101–111)
CO2: 26 mmol/L (ref 22–32)
Creatinine, Ser: 1.13 mg/dL (ref 0.61–1.24)
GFR calc Af Amer: >60 mL/min (ref >60)
GFR calc non Af Amer: >60 mL/min (ref >60)
GLUCOSE: 101 mg/dL — AB (ref 65–99)
POTASSIUM: 4 mmol/L (ref 3.5–5.1)
SODIUM: 139 mmol/L (ref 135–145)
Total Bilirubin: 0.6 mg/dL (ref 0.3–1.2)
Total Protein: 7.5 g/dL (ref 6.5–8.1)

## 2018-01-22 LAB — CBC
HCT: 47.1 % (ref 40.0–52.0)
HEMOGLOBIN: 16.1 g/dL (ref 13.0–18.0)
MCH: 32 pg (ref 26.0–34.0)
MCHC: 34.2 g/dL (ref 32.0–36.0)
MCV: 93.6 fL (ref 80.0–100.0)
Platelets: 151 10*3/uL (ref 150–440)
RBC: 5.04 MIL/uL (ref 4.40–5.90)
RDW: 13.9 % (ref 11.5–14.5)
WBC: 14.8 10*3/uL — ABNORMAL HIGH (ref 3.8–10.6)

## 2018-01-22 MED ORDER — KETOROLAC TROMETHAMINE 30 MG/ML IJ SOLN
INTRAMUSCULAR | Status: AC
  Administered 2018-01-22: 15 mg via INTRAVENOUS
  Filled 2018-01-22: qty 1

## 2018-01-22 MED ORDER — ONDANSETRON HCL 4 MG/2ML IJ SOLN
4.0000 mg | Freq: Once | INTRAMUSCULAR | Status: AC
  Administered 2018-01-22: 4 mg via INTRAVENOUS
  Filled 2018-01-22: qty 2

## 2018-01-22 MED ORDER — MORPHINE SULFATE (PF) 4 MG/ML IV SOLN: 4.0000 mg | INTRAVENOUS | Status: DC | PRN

## 2018-01-22 MED ORDER — PROMETHAZINE HCL 12.5 MG PO TABS: 12.5000 mg | ORAL_TABLET | Freq: Four times a day (QID) | ORAL | 0 refills | Status: DC | PRN

## 2018-01-22 MED ORDER — HYDROCODONE-ACETAMINOPHEN 5-325 MG PO TABS
1.0000 | ORAL_TABLET | Freq: Once | ORAL | Status: AC
  Administered 2018-01-22: 1 via ORAL
  Filled 2018-01-22: qty 1

## 2018-01-22 MED ORDER — KETOROLAC TROMETHAMINE 30 MG/ML IJ SOLN
15.0000 mg | Freq: Once | INTRAMUSCULAR | Status: AC
  Administered 2018-01-22: 15 mg via INTRAVENOUS

## 2018-01-22 MED ORDER — FENTANYL CITRATE (PF) 100 MCG/2ML IJ SOLN
50.0000 ug | INTRAMUSCULAR | Status: DC | PRN
  Administered 2018-01-22: 50 ug via INTRAVENOUS
  Filled 2018-01-22: qty 2

## 2018-01-22 MED ORDER — HYDROCODONE-ACETAMINOPHEN 5-325 MG PO TABS: 1.0000 | ORAL_TABLET | ORAL | 0 refills | Status: DC | PRN

## 2018-01-22 MED ORDER — TAMSULOSIN HCL 0.4 MG PO CAPS: 0.4000 mg | ORAL_CAPSULE | Freq: Every day | ORAL | 0 refills | Status: DC

## 2018-01-22 MED ORDER — SODIUM CHLORIDE 0.9 % IV BOLUS (SEPSIS)
1000.0000 mL | Freq: Once | INTRAVENOUS | Status: AC
  Administered 2018-01-22: 1000 mL via INTRAVENOUS

## 2018-01-22 NOTE — ED Notes (Signed)
Pt ambulated to the bathroom to void.   

## 2018-01-22 NOTE — ED Notes (Signed)
[facility]

## 2018-01-22 NOTE — ED Provider Notes (Signed)
[facility] Emergency Department Provider Note    First MD Initiated Contact with Patient 01/22/18 1658     (approximate)  I have reviewed the triage vital signs and the nursing notes.   HISTORY  Chief Complaint Flank Pain    HPI Gary Miranda is a 37 y.o. male with a history of hepatitis C presents with right flank pain that started yesterday associated with nausea and vomiting.  States he does have some stinging when he urinates but has not noted any sort of hematuria.  No fevers.  States he has a remote history of kidney stones but this feels slightly different.  Did endorse drinking alcohol last week but denies any anterior abdominal pain.  States it is mild to moderate in severity.  No past medical history on file. No family history on file. No past surgical history on file. There are no active problems to display for this patient.     Prior to Admission medications   Medication Sig Start Date End Date Taking? Authorizing Provider  clindamycin (CLEOCIN) 150 MG capsule Take 1 capsule (150 mg total) by mouth 4 (four) times daily. 11/04/17   Joni Reining, PA-C  doxycycline (VIBRAMYCIN) 100 MG capsule Take 1 capsule (100 mg total) by mouth 2 (two) times daily. 11/24/17   Triplett, Rulon Eisenmenger B, FNP  HYDROcodone-acetaminophen (NORCO) 5-325 MG tablet Take 1 tablet by mouth every 4 (four) hours as needed for moderate pain. 01/22/18   Willy Eddy, MD  ibuprofen (ADVIL,MOTRIN) 800 MG tablet Take 1 tablet (800 mg total) by mouth every 8 (eight) hours as needed for moderate pain. 11/04/17   Joni Reining, PA-C  naproxen (NAPROSYN) 500 MG tablet Take 1 tablet (500 mg total) by mouth 2 (two) times daily with a meal. 11/24/17   Triplett, Cari B, FNP  promethazine (PHENERGAN) 12.5 MG tablet Take 1 tablet (12.5 mg total) by mouth every 6 (six) hours as needed for nausea or vomiting. 01/22/18   Willy Eddy, MD  tamsulosin (FLOMAX) 0.4 MG CAPS capsule Take 1 capsule  (0.4 mg total) by mouth daily after supper. 01/22/18   Willy Eddy, MD  traMADol (ULTRAM) 50 MG tablet Take 1 tablet (50 mg total) by mouth every 6 (six) hours as needed. 11/24/17   Chinita Pester, FNP    Allergies Penicillins    Social History Social History   Tobacco Use  . Smoking status: Current Every Day Smoker    Packs/day: 1.00  . Smokeless tobacco: Never Used  Substance Use Topics  . Alcohol use: Yes    Comment: Pt states "I drink at least a fifth every other day"  . Drug use: Yes    Types: Cocaine, IV    Comment: heroine; percocet, oxycodone    Review of Systems Patient denies headaches, rhinorrhea, blurry vision, numbness, shortness of breath, chest pain, edema, cough, abdominal pain, nausea, vomiting, diarrhea, dysuria, fevers, rashes or hallucinations unless otherwise stated above in HPI. ____________________________________________   PHYSICAL EXAM:  VITAL SIGNS: Vitals:   01/22/18 1515  BP: 127/85  Pulse: 60  Resp: 18  Temp: (!) 97.5 F (36.4 C)  SpO2: 99%    Constitutional: Alert and oriented. Well appearing and in no acute distress. Eyes: Conjunctivae are normal.  Head: Atraumatic. Nose: No congestion/rhinnorhea. Mouth/Throat: Mucous membranes are moist.   Neck: No stridor. Painless ROM.  Cardiovascular: Normal rate, regular rhythm. Grossly normal heart sounds.  Good peripheral circulation. Respiratory: Normal respiratory effort.  No retractions. Lungs CTAB.  Gastrointestinal: Soft and nontender. No distention. No abdominal bruits. + right CVA tenderness. Genitourinary:  Musculoskeletal: No lower extremity tenderness nor edema.  No joint effusions. Neurologic:  Normal speech and language. No gross focal neurologic deficits are appreciated. No facial droop Skin:  Skin is warm, dry and intact. No rash noted. Psychiatric: Mood and affect are normal. Speech and behavior are normal.  ____________________________________________   LABS (all labs  ordered are listed, but only abnormal results are displayed)  Results for orders placed or performed during the hospital encounter of 01/22/18 (from the past 24 hour(s))  Urinalysis, Complete w Microscopic     Status: Abnormal   Collection Time: 01/22/18  3:15 PM  Result Value Ref Range   Color, Urine YELLOW (A) YELLOW   APPearance CLEAR (A) CLEAR   Specific Gravity, Urine 1.018 1.005 - 1.030   pH 5.0 5.0 - 8.0   Glucose, UA NEGATIVE NEGATIVE mg/dL   Hgb urine dipstick MODERATE (A) NEGATIVE   Bilirubin Urine NEGATIVE NEGATIVE   Ketones, ur NEGATIVE NEGATIVE mg/dL   Protein, ur 30 (A) NEGATIVE mg/dL   Nitrite NEGATIVE NEGATIVE   Leukocytes, UA NEGATIVE NEGATIVE   RBC / HPF TOO NUMEROUS TO COUNT 0 - 5 RBC/hpf   WBC, UA 6-30 0 - 5 WBC/hpf   Bacteria, UA NONE SEEN NONE SEEN   Squamous Epithelial / LPF 0-5 (A) NONE SEEN  CBC     Status: Abnormal   Collection Time: 01/22/18  3:17 PM  Result Value Ref Range   WBC 14.8 (H) 3.8 - 10.6 K/uL   RBC 5.04 4.40 - 5.90 MIL/uL   Hemoglobin 16.1 13.0 - 18.0 g/dL   HCT 16.1 09.6 - 04.5 %   MCV 93.6 80.0 - 100.0 fL   MCH 32.0 26.0 - 34.0 pg   MCHC 34.2 32.0 - 36.0 g/dL   RDW 40.9 81.1 - 91.4 %   Platelets 151 150 - 440 K/uL  Comprehensive metabolic panel     Status: Abnormal   Collection Time: 01/22/18  3:17 PM  Result Value Ref Range   Sodium 139 135 - 145 mmol/L   Potassium 4.0 3.5 - 5.1 mmol/L   Chloride 105 101 - 111 mmol/L   CO2 26 22 - 32 mmol/L   Glucose, Bld 101 (H) 65 - 99 mg/dL   BUN 13 6 - 20 mg/dL   Creatinine, Ser 7.82 0.61 - 1.24 mg/dL   Calcium 9.0 8.9 - 95.6 mg/dL   Total Protein 7.5 6.5 - 8.1 g/dL   Albumin 4.3 3.5 - 5.0 g/dL   AST 35 15 - 41 U/L   ALT 47 17 - 63 U/L   Alkaline Phosphatase 75 38 - 126 U/L   Total Bilirubin 0.6 0.3 - 1.2 mg/dL   GFR calc non Af Amer >60 >60 mL/min   GFR calc Af Amer >60 >60 mL/min   Anion gap 8 5 - 15    ____________________________________________ ____________________________________________  RADIOLOGY  I personally reviewed all radiographic images ordered to evaluate for the above acute complaints and reviewed radiology reports and findings.  These findings were personally discussed with the patient.  Please see medical record for radiology report.  ____________________________________________   PROCEDURES  Procedure(s) performed:  Procedures    Critical Care performed: no ____________________________________________   INITIAL IMPRESSION / ASSESSMENT AND PLAN / ED COURSE  Pertinent labs & imaging results that were available during my care of the patient were reviewed by me and considered in my medical decision  making (see chart for details).  DDX: stone, cholelithiasis, cholecystitis, appy, msk strain, colitis, ibd  Charlesetta IvoryJason Schwertner is a 37 y.o. who presents to the ED with p/w right flank pain. No fevers, no systemic symptoms. + urinary symptoms. Denies trauma or injury. Afebrile in ED. Exam as above. Flank TTP, otherwise abdominal exam is benign. No peritoneal signs. Possible kidney stone, cystitis, or pyelonephritis. Marland Kitchen. UA with no evidence of infection but + hgb. CT Stone with right sided hydronephrosis with a 3 mm obstructing stone as well as incidental finding of enlarged liver with periportal edema consistent with the patient's h/o hep c. Clinical picture is not consistent with appendicitis, diverticulitis, pancreatitis, cholecystitis, bowel perforation, aortic dissection, splenic injury or acute abdominal process at this time.   Clinical Course as of Jan 22 1749  Mon Jan 22, 2018  1743 She reassessed and updated on CT results.  Patient does have a history of hepatitis C and has been recently drinking.  Denies any nausea or vomiting at this time.  Right upper quadrant is nontender.  This is not reflective of acute cholecystitis more likely reflecting reduction of his underlying  hep C with alcohol use.  Will give referral to GI for follow up.    [PR]    Clinical Course User Index [PR] Willy Eddyobinson, Jammal Sarr, MD    Pain improved, tolerating PO. Repeat ABD exam benign, will plan supportive treatment and early follow up for recheck.   ____________________________________________   FINAL CLINICAL IMPRESSION(S) / ED DIAGNOSES  Final diagnoses:  Ureterolithiasis  Right flank pain  Abnormal CT of liver      NEW MEDICATIONS STARTED DURING THIS VISIT:  New Prescriptions   HYDROCODONE-ACETAMINOPHEN (NORCO) 5-325 MG TABLET    Take 1 tablet by mouth every 4 (four) hours as needed for moderate pain.   PROMETHAZINE (PHENERGAN) 12.5 MG TABLET    Take 1 tablet (12.5 mg total) by mouth every 6 (six) hours as needed for nausea or vomiting.   TAMSULOSIN (FLOMAX) 0.4 MG CAPS CAPSULE    Take 1 capsule (0.4 mg total) by mouth daily after supper.     Note:  This document was prepared using Dragon voice recognition software and may include unintentional dictation errors.    Willy Eddyobinson, Iyonna Rish, MD 01/22/18 1750

## 2018-01-22 NOTE — ED Triage Notes (Signed)
R flank pain began yesterday. Nausea and vomiting. Stinging with urination

## 2018-01-22 NOTE — Discharge Instructions (Signed)

## 2018-07-31 ENCOUNTER — Encounter: Payer: Self-pay | Admitting: Emergency Medicine

## 2018-07-31 ENCOUNTER — Emergency Department

## 2018-07-31 ENCOUNTER — Other Ambulatory Visit: Payer: Self-pay

## 2018-07-31 ENCOUNTER — Emergency Department
Admission: EM | Admit: 2018-07-31 | Discharge: 2018-07-31 | Disposition: A | Discharge status: Home / Self Care | Attending: Student in an Organized Health Care Education/Training Program | Admitting: Student in an Organized Health Care Education/Training Program

## 2018-07-31 DIAGNOSIS — F17200 Nicotine dependence, unspecified, uncomplicated: Secondary | ICD-10-CM | POA: Insufficient documentation

## 2018-07-31 DIAGNOSIS — Z79899 Other long term (current) drug therapy: Secondary | ICD-10-CM | POA: Insufficient documentation

## 2018-07-31 DIAGNOSIS — S022XXA Fracture of nasal bones, initial encounter for closed fracture: Secondary | ICD-10-CM | POA: Diagnosis not present

## 2018-07-31 DIAGNOSIS — Y9389 Activity, other specified: Secondary | ICD-10-CM | POA: Insufficient documentation

## 2018-07-31 DIAGNOSIS — Y998 Other external cause status: Secondary | ICD-10-CM | POA: Insufficient documentation

## 2018-07-31 DIAGNOSIS — Z23 Encounter for immunization: Secondary | ICD-10-CM | POA: Insufficient documentation

## 2018-07-31 DIAGNOSIS — S0990XA Unspecified injury of head, initial encounter: Secondary | ICD-10-CM

## 2018-07-31 DIAGNOSIS — S01112A Laceration without foreign body of left eyelid and periocular area, initial encounter: Secondary | ICD-10-CM | POA: Insufficient documentation

## 2018-07-31 DIAGNOSIS — Y92149 Unspecified place in prison as the place of occurrence of the external cause: Secondary | ICD-10-CM | POA: Diagnosis not present

## 2018-07-31 DIAGNOSIS — S0181XA Laceration without foreign body of other part of head, initial encounter: Secondary | ICD-10-CM

## 2018-07-31 DIAGNOSIS — S01119A Laceration without foreign body of unspecified eyelid and periocular area, initial encounter: Secondary | ICD-10-CM

## 2018-07-31 LAB — COMPREHENSIVE METABOLIC PANEL
ALBUMIN: 4 g/dL (ref 3.5–5.0)
ALT: 56 U/L — AB (ref 0–44)
AST: 48 U/L — AB (ref 15–41)
Alkaline Phosphatase: 62 U/L (ref 38–126)
Anion gap: 8 (ref 5–15)
BUN: 18 mg/dL (ref 6–20)
CHLORIDE: 103 mmol/L (ref 98–111)
CO2: 28 mmol/L (ref 22–32)
CREATININE: 0.96 mg/dL (ref 0.61–1.24)
Calcium: 9.3 mg/dL (ref 8.9–10.3)
GFR calc Af Amer: >60 mL/min (ref >60)
GLUCOSE: 97 mg/dL (ref 70–99)
POTASSIUM: 3.9 mmol/L (ref 3.5–5.1)
SODIUM: 139 mmol/L (ref 135–145)
Total Bilirubin: 0.5 mg/dL (ref 0.3–1.2)
Total Protein: 7.5 g/dL (ref 6.5–8.1)

## 2018-07-31 LAB — CBC
HCT: 46.1 % (ref 40.0–52.0)
Hemoglobin: 16.2 g/dL (ref 13.0–18.0)
MCH: 32.5 pg (ref 26.0–34.0)
MCHC: 35.2 g/dL (ref 32.0–36.0)
MCV: 92.4 fL (ref 80.0–100.0)
PLATELETS: 203 10*3/uL (ref 150–440)
RBC: 4.98 MIL/uL (ref 4.40–5.90)
RDW: 13 % (ref 11.5–14.5)
WBC: 12.5 10*3/uL — AB (ref 3.8–10.6)

## 2018-07-31 MED ORDER — HYDROCODONE-ACETAMINOPHEN 5-325 MG PO TABS
1.0000 | ORAL_TABLET | Freq: Once | ORAL | Status: AC
  Administered 2018-07-31: 1 via ORAL
  Filled 2018-07-31: qty 1

## 2018-07-31 MED ORDER — TETANUS-DIPHTH-ACELL PERTUSSIS 5-2.5-18.5 LF-MCG/0.5 IM SUSP
0.5000 mL | Freq: Once | INTRAMUSCULAR | Status: AC
  Administered 2018-07-31: 0.5 mL via INTRAMUSCULAR
  Filled 2018-07-31: qty 0.5

## 2018-07-31 MED ORDER — CLINDAMYCIN HCL 150 MG PO CAPS
300.0000 mg | ORAL_CAPSULE | Freq: Once | ORAL | Status: AC
  Administered 2018-07-31: 300 mg via ORAL
  Filled 2018-07-31: qty 2

## 2018-07-31 MED ORDER — LIDOCAINE-EPINEPHRINE-TETRACAINE (LET) SOLUTION
3.0000 mL | Freq: Once | NASAL | Status: AC
  Administered 2018-07-31: 3 mL via TOPICAL

## 2018-07-31 MED ORDER — LIDOCAINE HCL (PF) 1 % IJ SOLN
5.0000 mL | Freq: Once | INTRAMUSCULAR | Status: AC
  Administered 2018-07-31: 5 mL via INTRADERMAL
  Filled 2018-07-31: qty 5

## 2018-07-31 MED ORDER — CLINDAMYCIN HCL 300 MG PO CAPS: 300.0000 mg | ORAL_CAPSULE | Freq: Three times a day (TID) | ORAL | 0 refills | Status: AC

## 2018-07-31 MED ORDER — SODIUM CHLORIDE 0.9 % IV BOLUS
1000.0000 mL | Freq: Once | INTRAVENOUS | Status: AC
  Administered 2018-07-31: 1000 mL via INTRAVENOUS

## 2018-07-31 MED ORDER — LIDOCAINE-EPINEPHRINE-TETRACAINE (LET) SOLUTION
3.0000 mL | Freq: Once | NASAL | Status: AC
  Administered 2018-07-31: 3 mL via TOPICAL
  Filled 2018-07-31: qty 3

## 2018-07-31 MED ORDER — NAPROXEN 500 MG PO TABS: 500.0000 mg | ORAL_TABLET | Freq: Two times a day (BID) | ORAL | 0 refills | Status: AC

## 2018-07-31 MED ORDER — LIDOCAINE HCL (PF) 1 % IJ SOLN
INTRAMUSCULAR | Status: AC
  Administered 2018-07-31: 5 mL via INTRADERMAL
  Filled 2018-07-31: qty 5
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
## 2018-07-31 NOTE — Discharge Instructions (Addendum)
Apply bacitracin or neosporin twice daily.  Sutures to be removed in 7 days.

## 2019-03-09 ENCOUNTER — Other Ambulatory Visit: Payer: Self-pay

## 2019-03-09 ENCOUNTER — Emergency Department
Admission: EM | Admit: 2019-03-09 | Discharge: 2019-03-09 | Discharge status: Against Medical Advice | Attending: Emergency Medicine | Admitting: Emergency Medicine

## 2019-03-09 ENCOUNTER — Encounter: Payer: Self-pay | Admitting: Emergency Medicine

## 2019-03-09 DIAGNOSIS — Z139 Encounter for screening, unspecified: Secondary | ICD-10-CM

## 2019-03-09 DIAGNOSIS — T50901A Poisoning by unspecified drugs, medicaments and biological substances, accidental (unintentional), initial encounter: Secondary | ICD-10-CM | POA: Insufficient documentation

## 2019-03-09 NOTE — ED Notes (Signed)
Dr Mayford Knife in to assess pt. Pt reports his stomach was upset and he took some pills trying to make it feel better. Took the pills 3 to 4 hours ago. States felt "jittery". Now feeling better.

## 2019-03-09 NOTE — ED Triage Notes (Signed)
EMS pt to room 222 from home with report of taking 4 unknown pills given to him by a friend today. Pt reports he was feeling "jittery" but is now feeling better.

## 2019-03-09 NOTE — ED Provider Notes (Signed)
[facility] Emergency Department Provider Note       Time seen: ----------------------------------------- 10:53 PM on 03/09/2019 -----------------------------------------   I have reviewed the triage vital signs and the nursing notes.  HISTORY   Chief Complaint No chief complaint on file.    HPI Gary Miranda is a 38 y.o. male with no significant past medical history who presents to the ED for not feeling well after he took 4 unknown pills from a friend.  Patient was talking to a friend of his and stated that he was not feeling well.  The friend of his told him he would give him "something that would make him feel good".  Patient does not know what was in these pills or any of the ingredients.  He reports not feeling well but the symptoms have subsequently resolved.  No past medical history on file.  There are no active problems to display for this patient.   Past Surgical History:  Procedure Laterality Date  . TONSILLECTOMY      Allergies Penicillins  Social History Social History   Tobacco Use  . Smoking status: Current Every Day Smoker    Packs/day: 1.00  . Smokeless tobacco: Never Used  Substance Use Topics  . Alcohol use: Yes    Comment: Pt states "I drink at least a fifth every other day"  . Drug use: Yes    Types: Cocaine, IV    Comment: last use 1 week ago 07/24/18   Review of Systems Constitutional: Negative for fever. Cardiovascular: Negative for chest pain. Respiratory: Negative for shortness of breath. Gastrointestinal: Positive for abdominal pain Musculoskeletal: Negative for back pain. Skin: Negative for rash. Neurological: Negative for headaches, focal weakness or numbness.  All systems negative/normal/unremarkable except as stated in the HPI  ____________________________________________   PHYSICAL EXAM:  VITAL SIGNS: ED Triage Vitals  Enc Vitals Group     BP      Pulse      Resp      Temp      Temp src       SpO2      Weight      Height      Head Circumference      Peak Flow      Pain Score      Pain Loc      Pain Edu?      Excl. in GC?    Constitutional: Alert and oriented. Well appearing and in no distress. Cardiovascular: Normal rate, regular rhythm. No murmurs, rubs, or gallops. Respiratory: Normal respiratory effort without tachypnea nor retractions. Breath sounds are clear and equal bilaterally. No wheezes/rales/rhonchi. Gastrointestinal: Soft and nontender. Normal bowel sounds Musculoskeletal: Nontender with normal range of motion in extremities. No lower extremity tenderness nor edema. Neurologic:  Normal speech and language. No gross focal neurologic deficits are appreciated.  Skin:  Skin is warm, dry and intact. No rash noted. Psychiatric: Mood and affect are normal. Speech and behavior are normal.  ____________________________________________  ED COURSE:  As part of my medical decision making, I reviewed the following data within the electronic MEDICAL RECORD NUMBER History obtained from family if available, nursing notes, old chart and ekg, as well as notes from prior ED visits. Patient presented for general ill feeling after taking pills from a friend, patient does not want to receive any further treatment in the ER, states he feels better.   Procedures  Gary Miranda was evaluated in Emergency Department on 03/09/2019 for the symptoms described  in the history of present illness. He was evaluated in the context of the global COVID-19 pandemic, which necessitated consideration that the patient might be at risk for infection with the SARS-CoV-2 virus that causes COVID-19. Institutional protocols and algorithms that pertain to the evaluation of patients at risk for COVID-19 are in a state of rapid change based on information released by regulatory bodies including the CDC and federal and state organizations. These policies and algorithms were followed during the patient's care in the ED.   ____________________________________________   DIFFERENTIAL DIAGNOSIS   Medical screening exam, substance abuse, intoxication  FINAL ASSESSMENT AND PLAN  Medical screening exam   Plan: The patient had presented for medical screening exam.  Patient appears to be medically stable at this time.  He denies suicidal or homicidal ideations.  He states he was not taking these pills to harm himself.  I did recommend observation with some testing here which he has declined.   Ulice DashJohnathan E Mercedez Boule, MD    Note: This note was generated in part or whole with voice recognition software. Voice recognition is usually quite accurate but there are transcription errors that can and very often do occur. I apologize for any typographical errors that were not detected and corrected.     Emily FilbertWilliams, Avynn Klassen E, MD 03/09/19 2255

## 2019-06-01 ENCOUNTER — Other Ambulatory Visit: Payer: Self-pay

## 2019-06-01 ENCOUNTER — Emergency Department: Payer: Self-pay

## 2019-06-01 ENCOUNTER — Emergency Department
Admission: EM | Admit: 2019-06-01 | Discharge: 2019-06-01 | Disposition: A | Discharge status: Home / Self Care | Payer: Self-pay | Attending: Emergency Medicine | Admitting: Emergency Medicine

## 2019-06-01 DIAGNOSIS — R109 Unspecified abdominal pain: Secondary | ICD-10-CM | POA: Insufficient documentation

## 2019-06-01 DIAGNOSIS — Z5321 Procedure and treatment not carried out due to patient leaving prior to being seen by health care provider: Secondary | ICD-10-CM | POA: Insufficient documentation

## 2019-06-01 DIAGNOSIS — K5900 Constipation, unspecified: Secondary | ICD-10-CM | POA: Insufficient documentation

## 2019-06-01 LAB — CBC
HCT: 48.4 % (ref 39.0–52.0)
Hemoglobin: 17.2 g/dL — ABNORMAL HIGH (ref 13.0–17.0)
MCH: 32.9 pg (ref 26.0–34.0)
MCHC: 35.5 g/dL (ref 30.0–36.0)
MCV: 92.5 fL (ref 80.0–100.0)
Platelets: 145 10*3/uL — ABNORMAL LOW (ref 150–400)
RBC: 5.23 MIL/uL (ref 4.22–5.81)
RDW: 13 % (ref 11.5–15.5)
WBC: 12.7 10*3/uL — ABNORMAL HIGH (ref 4.0–10.5)
nRBC: 0 % (ref 0.0–0.2)

## 2019-06-01 LAB — COMPREHENSIVE METABOLIC PANEL
ALT: 42 U/L (ref 0–44)
AST: 32 U/L (ref 15–41)
Albumin: 4.1 g/dL (ref 3.5–5.0)
Alkaline Phosphatase: 67 U/L (ref 38–126)
Anion gap: 9 (ref 5–15)
BUN: 14 mg/dL (ref 6–20)
CO2: 23 mmol/L (ref 22–32)
Calcium: 9 mg/dL (ref 8.9–10.3)
Chloride: 107 mmol/L (ref 98–111)
Creatinine, Ser: 1.02 mg/dL (ref 0.61–1.24)
GFR calc Af Amer: >60 mL/min (ref >60)
GFR calc non Af Amer: >60 mL/min (ref >60)
Glucose, Bld: 115 mg/dL — ABNORMAL HIGH (ref 70–99)
Potassium: 3.7 mmol/L (ref 3.5–5.1)
Sodium: 139 mmol/L (ref 135–145)
Total Bilirubin: 0.8 mg/dL (ref 0.3–1.2)
Total Protein: 7.1 g/dL (ref 6.5–8.1)

## 2019-06-01 LAB — LIPASE, BLOOD: Lipase: 27 U/L (ref 11–51)

## 2019-06-01 NOTE — ED Triage Notes (Signed)
Patient reports no bowel movement for 2 days and started with abdominal pain last night.

## 2019-06-03 ENCOUNTER — Telehealth: Payer: Self-pay | Admitting: Emergency Medicine

## 2019-06-03 NOTE — Telephone Encounter (Signed)
Called patient due to lwot to inquire about condition and follow up plans. Left message.   

## 2019-06-07 ENCOUNTER — Other Ambulatory Visit: Payer: Self-pay

## 2019-06-07 ENCOUNTER — Emergency Department: Payer: Self-pay

## 2019-06-07 ENCOUNTER — Emergency Department
Admission: EM | Admit: 2019-06-07 | Discharge: 2019-06-07 | Disposition: A | Discharge status: Home / Self Care | Payer: Self-pay | Attending: Emergency Medicine | Admitting: Emergency Medicine

## 2019-06-07 DIAGNOSIS — F17210 Nicotine dependence, cigarettes, uncomplicated: Secondary | ICD-10-CM | POA: Insufficient documentation

## 2019-06-07 DIAGNOSIS — Z79899 Other long term (current) drug therapy: Secondary | ICD-10-CM | POA: Insufficient documentation

## 2019-06-07 DIAGNOSIS — T402X1A Poisoning by other opioids, accidental (unintentional), initial encounter: Secondary | ICD-10-CM

## 2019-06-07 LAB — CBC
HCT: 48.8 % (ref 39.0–52.0)
Hemoglobin: 16.6 g/dL (ref 13.0–17.0)
MCH: 32.7 pg (ref 26.0–34.0)
MCHC: 34 g/dL (ref 30.0–36.0)
MCV: 96.3 fL (ref 80.0–100.0)
Platelets: 90 10*3/uL — ABNORMAL LOW (ref 150–400)
RBC: 5.07 MIL/uL (ref 4.22–5.81)
RDW: 13.4 % (ref 11.5–15.5)
WBC: 9.4 10*3/uL (ref 4.0–10.5)
nRBC: 0 % (ref 0.0–0.2)

## 2019-06-07 LAB — BASIC METABOLIC PANEL
Anion gap: 12 (ref 5–15)
BUN: 12 mg/dL (ref 6–20)
CO2: 29 mmol/L (ref 22–32)
Calcium: 9 mg/dL (ref 8.9–10.3)
Chloride: 99 mmol/L (ref 98–111)
Creatinine, Ser: 1.04 mg/dL (ref 0.61–1.24)
GFR calc Af Amer: >60 mL/min (ref >60)
GFR calc non Af Amer: >60 mL/min (ref >60)
Glucose, Bld: 176 mg/dL — ABNORMAL HIGH (ref 70–99)
Potassium: 3.7 mmol/L (ref 3.5–5.1)
Sodium: 140 mmol/L (ref 135–145)

## 2019-06-07 MED ORDER — SODIUM CHLORIDE 0.9 % IV BOLUS
1000.0000 mL | Freq: Once | INTRAVENOUS | Status: AC
  Administered 2019-06-07: 17:00:00 1000 mL via INTRAVENOUS

## 2019-06-07 MED ORDER — ONDANSETRON HCL 4 MG/2ML IJ SOLN
4.0000 mg | Freq: Once | INTRAMUSCULAR | Status: AC
  Administered 2019-06-07: 4 mg via INTRAVENOUS
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
## 2019-06-07 NOTE — ED Notes (Signed)
4 of zofran by [employer] RN

## 2019-06-07 NOTE — ED Notes (Signed)
Pt verbalized discharge instructions and has no questions at this time 

## 2019-06-07 NOTE — ED Notes (Signed)
Pt alert and awake. Pt speaking in clear and complete sentences. Pt admits to taking a perocet earlier that he bought from someone off the street. Pt also admits to using crack last night.

## 2019-09-17 DIAGNOSIS — S42035D Nondisplaced fracture of lateral end of left clavicle, subsequent encounter for fracture with routine healing: Secondary | ICD-10-CM | POA: Insufficient documentation

## 2020-02-26 ENCOUNTER — Other Ambulatory Visit
Admit: 2020-02-26 | Discharge: 2020-02-26 | Disposition: A | Discharge status: Home / Self Care | Payer: Self-pay | Attending: Family Medicine | Admitting: Family Medicine

## 2020-02-26 ENCOUNTER — Emergency Department: Payer: Self-pay

## 2020-02-26 ENCOUNTER — Encounter: Payer: Self-pay | Admitting: Emergency Medicine

## 2020-02-26 ENCOUNTER — Emergency Department
Admission: EM | Admit: 2020-02-26 | Discharge: 2020-02-26 | Disposition: A | Discharge status: Other Acute Inpatient Hospital | Payer: Self-pay | Attending: Emergency Medicine | Admitting: Emergency Medicine

## 2020-02-26 DIAGNOSIS — Y999 Unspecified external cause status: Secondary | ICD-10-CM | POA: Insufficient documentation

## 2020-02-26 DIAGNOSIS — Y939 Activity, unspecified: Secondary | ICD-10-CM | POA: Insufficient documentation

## 2020-02-26 DIAGNOSIS — F17200 Nicotine dependence, unspecified, uncomplicated: Secondary | ICD-10-CM | POA: Insufficient documentation

## 2020-02-26 DIAGNOSIS — Z23 Encounter for immunization: Secondary | ICD-10-CM | POA: Insufficient documentation

## 2020-02-26 DIAGNOSIS — Z20822 Contact with and (suspected) exposure to covid-19: Secondary | ICD-10-CM | POA: Insufficient documentation

## 2020-02-26 DIAGNOSIS — S36115A Moderate laceration of liver, initial encounter: Secondary | ICD-10-CM | POA: Insufficient documentation

## 2020-02-26 DIAGNOSIS — S27321A Contusion of lung, unilateral, initial encounter: Secondary | ICD-10-CM | POA: Insufficient documentation

## 2020-02-26 DIAGNOSIS — Z79899 Other long term (current) drug therapy: Secondary | ICD-10-CM | POA: Insufficient documentation

## 2020-02-26 DIAGNOSIS — S0101XA Laceration without foreign body of scalp, initial encounter: Secondary | ICD-10-CM | POA: Insufficient documentation

## 2020-02-26 DIAGNOSIS — Y92410 Unspecified street and highway as the place of occurrence of the external cause: Secondary | ICD-10-CM | POA: Insufficient documentation

## 2020-02-26 LAB — COMPREHENSIVE METABOLIC PANEL
ALT: 17 U/L (ref 0–44)
AST: 20 U/L (ref 15–41)
Albumin: 3.6 g/dL (ref 3.5–5.0)
Alkaline Phosphatase: 66 U/L (ref 38–126)
Anion gap: 10 (ref 5–15)
BUN: 10 mg/dL (ref 6–20)
CO2: 24 mmol/L (ref 22–32)
Calcium: 8.7 mg/dL — ABNORMAL LOW (ref 8.9–10.3)
Chloride: 105 mmol/L (ref 98–111)
Creatinine, Ser: 0.65 mg/dL (ref 0.61–1.24)
GFR calc Af Amer: >60 mL/min (ref >60)
GFR calc non Af Amer: >60 mL/min (ref >60)
Glucose, Bld: 100 mg/dL — ABNORMAL HIGH (ref 70–99)
Potassium: 3.6 mmol/L (ref 3.5–5.1)
Sodium: 139 mmol/L (ref 135–145)
Total Bilirubin: 0.6 mg/dL (ref 0.3–1.2)
Total Protein: 7.2 g/dL (ref 6.5–8.1)

## 2020-02-26 LAB — CBC WITH DIFFERENTIAL/PLATELET
Abs Immature Granulocytes: 0.04 10*3/uL (ref 0.00–0.07)
Basophils Absolute: 0 10*3/uL (ref 0.0–0.1)
Basophils Relative: 0 %
Eosinophils Absolute: 0.1 10*3/uL (ref 0.0–0.5)
Eosinophils Relative: 1 %
HCT: 42.3 % (ref 39.0–52.0)
Hemoglobin: 14.9 g/dL (ref 13.0–17.0)
Immature Granulocytes: 0 %
Lymphocytes Relative: 16 %
Lymphs Abs: 2 10*3/uL (ref 0.7–4.0)
MCH: 32.7 pg (ref 26.0–34.0)
MCHC: 35.2 g/dL (ref 30.0–36.0)
MCV: 92.8 fL (ref 80.0–100.0)
Monocytes Absolute: 0.8 10*3/uL (ref 0.1–1.0)
Monocytes Relative: 6 %
Neutro Abs: 9.5 10*3/uL — ABNORMAL HIGH (ref 1.7–7.7)
Neutrophils Relative %: 77 %
Platelets: 182 10*3/uL (ref 150–400)
RBC: 4.56 MIL/uL (ref 4.22–5.81)
RDW: 12.7 % (ref 11.5–15.5)
WBC: 12.4 10*3/uL — ABNORMAL HIGH (ref 4.0–10.5)
nRBC: 0 % (ref 0.0–0.2)

## 2020-02-26 LAB — URINE DRUG SCREEN, QUALITATIVE (ARMC ONLY)
Amphetamines, Ur Screen: NOT DETECTED
Barbiturates, Ur Screen: NOT DETECTED
Benzodiazepine, Ur Scrn: NOT DETECTED
Cannabinoid 50 Ng, Ur ~~LOC~~: POSITIVE — AB
Cocaine Metabolite,Ur ~~LOC~~: POSITIVE — AB
MDMA (Ecstasy)Ur Screen: NOT DETECTED
Methadone Scn, Ur: NOT DETECTED
Opiate, Ur Screen: NOT DETECTED
Phencyclidine (PCP) Ur S: NOT DETECTED
Tricyclic, Ur Screen: NOT DETECTED

## 2020-02-26 LAB — POC SARS CORONAVIRUS 2 AG: SARS Coronavirus 2 Ag: NEGATIVE

## 2020-02-26 LAB — ETHANOL: Alcohol, Ethyl (B): 58 mg/dL — ABNORMAL HIGH (ref <10)

## 2020-02-26 MED ORDER — TETANUS-DIPHTH-ACELL PERTUSSIS 5-2.5-18.5 LF-MCG/0.5 IM SUSP
0.5000 mL | Freq: Once | INTRAMUSCULAR | Status: AC
  Administered 2020-02-26: 03:00:00 0.5 mL via INTRAMUSCULAR
  Filled 2020-02-26: qty 0.5

## 2020-02-26 MED ORDER — SODIUM CHLORIDE 0.9 % IV BOLUS
1000.0000 mL | Freq: Once | INTRAVENOUS | Status: AC
  Administered 2020-02-26: 1000 mL via INTRAVENOUS

## 2020-02-26 MED ORDER — IOHEXOL 300 MG/ML  SOLN
100.0000 mL | Freq: Once | INTRAMUSCULAR | Status: AC | PRN
  Administered 2020-02-26: 100 mL via INTRAVENOUS

## 2020-02-26 NOTE — ED Notes (Signed)
Pt gives verbal consent to transfer; unable to sign pad at this time

## 2020-02-26 NOTE — ED Notes (Addendum)
PT in custody of     [place] [employer] officer       for forensic blood draw and medical clearance; pt Ox3,  pt voices good understanding of blood draw to be performed for forensic testing; This RN witnessed pt verbalize understanding of rights read to him by officers and pt gave written permission to PD as well as verbal permission to this RN for blood draw. pt verifies identity with name and DOB and consent signed; using sealed kit provided by officer, tourniquet applied to right upper arm; right rand region prepped with betadine swab and allowed to dry completely; stated to pt "big poke.. 1,2,3" and  needle inserted pt retracted arm needle came out from skin. PT apologized and voiced understanding that he needs to hold still for blood draw. Right hand area re-prepped and allowed to dry. Inserted needle above original stick site and 2 grey top blood tubes collected; tourniquet removed, needle removed & intact, dressing applied; tubes labeled, given to officer and placed in sealed container using chain of custody. PT continues to rest safely in stretcher.

## 2020-02-26 NOTE — ED Notes (Signed)
[facility] and Medical Necessity documentation reviewed at this time and found to be complete per policy. 
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
## 2020-02-26 NOTE — ED Notes (Signed)
2 IV attempts made by this RN
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
## 2020-03-10 ENCOUNTER — Encounter: Payer: Self-pay | Admitting: Emergency Medicine

## 2020-03-10 ENCOUNTER — Emergency Department
Admission: EM | Admit: 2020-03-10 | Discharge: 2020-03-10 | Disposition: A | Discharge status: Home / Self Care | Payer: Self-pay | Attending: Emergency Medicine | Admitting: Emergency Medicine

## 2020-03-10 ENCOUNTER — Other Ambulatory Visit: Payer: Self-pay

## 2020-03-10 DIAGNOSIS — S0101XD Laceration without foreign body of scalp, subsequent encounter: Secondary | ICD-10-CM | POA: Insufficient documentation

## 2020-03-10 DIAGNOSIS — X58XXXD Exposure to other specified factors, subsequent encounter: Secondary | ICD-10-CM | POA: Insufficient documentation

## 2020-03-10 DIAGNOSIS — Z4802 Encounter for removal of sutures: Secondary | ICD-10-CM

## 2020-03-10 NOTE — ED Notes (Signed)
Patient given verbal discharge instructions.  Patient unable to sign e-signature because he is being discharged from the waiting room.  Patient left without receiving his paper discharge instructions.  No prescriptions given and follow up instructions verbalized with patient verbalizing understanding.

## 2020-03-10 NOTE — ED Provider Notes (Signed)
[facility] Emergency Department Provider Note  ____________________________________________   First MD Initiated Contact with Patient 03/10/20 1653     (approximate)  I have reviewed the triage vital signs and the nursing notes.   HISTORY  Chief Complaint Suture / Staple Removal    HPI Gary Miranda is a 39 y.o. male presents emergency department for suture removal.  Patient states he had staples placed in his head.  No problems with the wound, no fever chills.  No drainage from site.    History reviewed. No pertinent past medical history.  There are no problems to display for this patient.   Past Surgical History:  Procedure Laterality Date  . CHEST TUBE INSERTION     x2  . TONSILLECTOMY      Prior to Admission medications   Medication Sig Start Date End Date Taking? Authorizing Provider  clindamycin (CLEOCIN) 150 MG capsule Take 1 capsule (150 mg total) by mouth 4 (four) times daily. 11/04/17   Joni Reining, PA-C  doxycycline (VIBRAMYCIN) 100 MG capsule Take 1 capsule (100 mg total) by mouth 2 (two) times daily. 11/24/17   Triplett, Rulon Eisenmenger B, FNP  HYDROcodone-acetaminophen (NORCO) 5-325 MG tablet Take 1 tablet by mouth every 4 (four) hours as needed for moderate pain. 01/22/18   Willy Eddy, MD  ibuprofen (ADVIL,MOTRIN) 800 MG tablet Take 1 tablet (800 mg total) by mouth every 8 (eight) hours as needed for moderate pain. 11/04/17   Joni Reining, PA-C  naproxen (NAPROSYN) 500 MG tablet Take 1 tablet (500 mg total) by mouth 2 (two) times daily with a meal. 11/24/17   Triplett, Cari B, FNP  promethazine (PHENERGAN) 12.5 MG tablet Take 1 tablet (12.5 mg total) by mouth every 6 (six) hours as needed for nausea or vomiting. 01/22/18   Willy Eddy, MD  tamsulosin (FLOMAX) 0.4 MG CAPS capsule Take 1 capsule (0.4 mg total) by mouth daily after supper. 01/22/18   Willy Eddy, MD  traMADol (ULTRAM) 50 MG tablet Take 1 tablet (50 mg total) by mouth  every 6 (six) hours as needed. 11/24/17   Kem Boroughs B, FNP    Allergies Penicillins  No family history on file.  Social History Social History   Tobacco Use  . Smoking status: Current Every Day Smoker    Packs/day: 1.00  . Smokeless tobacco: Never Used  Substance Use Topics  . Alcohol use: Yes    Comment: Pt states "I drink at least a fifth every other day"  . Drug use: Yes    Types: Cocaine, IV    Comment: last used crack 06/06/2019    Review of Systems  Constitutional: No fever/chills Eyes: No visual changes. ENT: No sore throat. Respiratory: Denies cough Genitourinary: Negative for dysuria. Musculoskeletal: Negative for back pain. Skin: Negative for rash. Psychiatric: no mood changes,     ____________________________________________   PHYSICAL EXAM:  VITAL SIGNS: ED Triage Vitals  Enc Vitals Group     BP 03/10/20 1659 104/71     Pulse Rate 03/10/20 1659 72     Resp 03/10/20 1659 19     Temp 03/10/20 1659 98.5 F (36.9 C)     Temp Source 03/10/20 1659 Oral     SpO2 03/10/20 1659 96 %     Weight 03/10/20 1651 150 lb (68 kg)     Height 03/10/20 1651 5\' 6"  (1.676 m)     Head Circumference --      Peak Flow --  Pain Score 03/10/20 1650 7     Pain Loc --      Pain Edu? --      Excl. in [place]? --     Constitutional: Alert and oriented. Well appearing and in no acute distress. Eyes: Conjunctivae are normal.  Head: 3 staples are in place, wound is well approximated, no redness pus or swelling noted Nose: No congestion/rhinnorhea..   Neck:  supple no lymphadenopathy noted Cardiovascular: Normal rate, regular rhythm. Respiratory: Normal respiratory effort.  No retractions, l GU: deferred Musculoskeletal: FROM all extremities, warm and well perfused Neurologic:  Normal speech and language.  Skin:  Skin is warm, dry and intact. No rash noted. Psychiatric: Mood and affect are normal. Speech and behavior are  normal.  ____________________________________________   LABS (all labs ordered are listed, but only abnormal results are displayed)  Labs Reviewed - No data to display ____________________________________________   ____________________________________________  RADIOLOGY    ____________________________________________   PROCEDURES  Procedure(s) performed:   .Suture Removal  Date/Time: 03/10/2020 5:05 PM Performed by: Versie Starks, PA-C Authorized by: Versie Starks, PA-C   Consent:    Consent obtained:  Verbal   Consent given by:  Patient   Risks discussed:  Bleeding, pain and wound separation Location:    Location:  Head/neck   Head/neck location:  Scalp Procedure details:    Wound appearance:  No signs of infection, good wound healing and nontender   Number of staples removed:  3 Post-procedure details:    Post-removal:  No dressing applied   Patient tolerance of procedure:  Tolerated well, no immediate complications      ____________________________________________   INITIAL IMPRESSION / ASSESSMENT AND PLAN / ED COURSE  Pertinent labs & imaging results that were available during my care of the patient were reviewed by me and considered in my medical decision making (see chart for details).   Patient is 39 year old male presents emergency department for staple removal.  No problems with staples.  Wound looks well approximated.  3 staples were removed and the patient tolerated procedure well.  He was discharged stable condition.    Gary Miranda was evaluated in Emergency Department on 03/10/2020 for the symptoms described in the history of present illness. He was evaluated in the context of the global COVID-19 pandemic, which necessitated consideration that the patient might be at risk for infection with the SARS-CoV-2 virus that causes COVID-19. Institutional protocols and algorithms that pertain to the evaluation of patients at risk for COVID-19 are in a  state of rapid change based on information released by regulatory bodies including the CDC and federal and state organizations. These policies and algorithms were followed during the patient's care in the ED.   As part of my medical decision making, I reviewed the following data within the [place] notes reviewed and incorporated, Old chart reviewed, Notes from prior ED visits and  Controlled Substance Database  ____________________________________________   FINAL CLINICAL IMPRESSION(S) / ED DIAGNOSES  Final diagnoses:  Visit for suture removal      NEW MEDICATIONS STARTED DURING THIS VISIT:  New Prescriptions   No medications on file     Note:  This document was prepared using Dragon voice recognition software and may include unintentional dictation errors.    Versie Starks, PA-C 03/10/20 1708    Carrie Mew, MD 03/12/20 [phone]

## 2020-03-10 NOTE — ED Triage Notes (Signed)
Patient presents to have staples removed on his head that were placed April 7th.  Patient is in no obvious distress at this time.

## 2020-05-27 ENCOUNTER — Emergency Department
Admission: EM | Admit: 2020-05-27 | Discharge: 2020-05-27 | Disposition: A | Discharge status: Home / Self Care | Payer: Self-pay | Attending: Emergency Medicine | Admitting: Emergency Medicine

## 2020-05-27 ENCOUNTER — Encounter: Payer: Self-pay | Admitting: *Deleted

## 2020-05-27 ENCOUNTER — Emergency Department: Payer: Self-pay

## 2020-05-27 ENCOUNTER — Other Ambulatory Visit: Payer: Self-pay

## 2020-05-27 DIAGNOSIS — M25512 Pain in left shoulder: Secondary | ICD-10-CM | POA: Insufficient documentation

## 2020-05-27 DIAGNOSIS — Y9229 Other specified public building as the place of occurrence of the external cause: Secondary | ICD-10-CM | POA: Insufficient documentation

## 2020-05-27 DIAGNOSIS — W01198A Fall on same level from slipping, tripping and stumbling with subsequent striking against other object, initial encounter: Secondary | ICD-10-CM | POA: Insufficient documentation

## 2020-05-27 DIAGNOSIS — Y939 Activity, unspecified: Secondary | ICD-10-CM | POA: Insufficient documentation

## 2020-05-27 DIAGNOSIS — F17210 Nicotine dependence, cigarettes, uncomplicated: Secondary | ICD-10-CM | POA: Insufficient documentation

## 2020-05-27 DIAGNOSIS — S40012A Contusion of left shoulder, initial encounter: Secondary | ICD-10-CM | POA: Insufficient documentation

## 2020-05-27 DIAGNOSIS — Z9861 Coronary angioplasty status: Secondary | ICD-10-CM | POA: Insufficient documentation

## 2020-05-27 DIAGNOSIS — Y999 Unspecified external cause status: Secondary | ICD-10-CM | POA: Insufficient documentation

## 2020-05-27 HISTORY — DX: Other specified postprocedural states: Z98.890

## 2020-05-27 LAB — CBC WITH DIFFERENTIAL/PLATELET
Abs Immature Granulocytes: 0.02 10*3/uL (ref 0.00–0.07)
Basophils Absolute: 0.1 10*3/uL (ref 0.0–0.1)
Basophils Relative: 0 %
Eosinophils Absolute: 0 10*3/uL (ref 0.0–0.5)
Eosinophils Relative: 0 %
HCT: 47.9 % (ref 39.0–52.0)
Hemoglobin: 16.9 g/dL (ref 13.0–17.0)
Immature Granulocytes: 0 %
Lymphocytes Relative: 10 %
Lymphs Abs: 1.1 10*3/uL (ref 0.7–4.0)
MCH: 32.1 pg (ref 26.0–34.0)
MCHC: 35.3 g/dL (ref 30.0–36.0)
MCV: 91.1 fL (ref 80.0–100.0)
Monocytes Absolute: 0.4 10*3/uL (ref 0.1–1.0)
Monocytes Relative: 3 %
Neutro Abs: 9.6 10*3/uL — ABNORMAL HIGH (ref 1.7–7.7)
Neutrophils Relative %: 87 %
Platelets: 195 10*3/uL (ref 150–400)
RBC: 5.26 MIL/uL (ref 4.22–5.81)
RDW: 12.9 % (ref 11.5–15.5)
WBC: 11.2 10*3/uL — ABNORMAL HIGH (ref 4.0–10.5)
nRBC: 0 % (ref 0.0–0.2)

## 2020-05-27 LAB — BASIC METABOLIC PANEL
Anion gap: 10 (ref 5–15)
BUN: 13 mg/dL (ref 6–20)
CO2: 26 mmol/L (ref 22–32)
Calcium: 9.4 mg/dL (ref 8.9–10.3)
Chloride: 103 mmol/L (ref 98–111)
Creatinine, Ser: 0.81 mg/dL (ref 0.61–1.24)
GFR calc Af Amer: >60 mL/min (ref >60)
GFR calc non Af Amer: >60 mL/min (ref >60)
Glucose, Bld: 105 mg/dL — ABNORMAL HIGH (ref 70–99)
Potassium: 3.7 mmol/L (ref 3.5–5.1)
Sodium: 139 mmol/L (ref 135–145)

## 2020-05-27 MED ORDER — NAPROXEN 375 MG PO TABS: 375.0000 mg | ORAL_TABLET | Freq: Two times a day (BID) | ORAL | 0 refills | Status: AC

## 2020-05-27 MED ORDER — KETOROLAC TROMETHAMINE 30 MG/ML IJ SOLN
30.0000 mg | Freq: Once | INTRAMUSCULAR | Status: AC
  Administered 2020-05-27: 30 mg via INTRAMUSCULAR
  Filled 2020-05-27: qty 1

## 2020-05-27 NOTE — ED Notes (Signed)
im meds given for pain

## 2020-05-27 NOTE — ED Notes (Signed)
See triage note  States he hit his left shoulder on door frame last pm  Having pain to anterior shoulder area.

## 2020-05-27 NOTE — ED Triage Notes (Signed)
Patient states he tripped over a fan in his hallway last night and hit the door frame with left shoulder. Patient states he had a previous injury to that shoulder. Patient reports one episode of dizziness which has resolved.

## 2020-05-27 NOTE — ED Notes (Signed)
Pt not in room for discharge instructions or to have sling applied

## 2020-05-27 NOTE — ED Provider Notes (Addendum)
[facility] Emergency Department Provider Note   ____________________________________________    I have reviewed the triage vital signs and the nursing notes.   HISTORY  Chief Complaint Shoulder Pain     HPI Gary Miranda is a 39 y.o. male who presents with complaints of left shoulder pain.  Patient reports he felt last night after tripping over an object onto his left shoulder.  He reports that hurts more today than yesterday.  He is able to move his left arm but it is uncomfortable in the shoulder, anteriorly.  Does report a history of a fractured clavicle in the past  Past Medical History:  Diagnosis Date  . History of cardiac cath     There are no problems to display for this patient.   Past Surgical History:  Procedure Laterality Date  . CHEST TUBE INSERTION     x2  . CORONARY ANGIOPLASTY WITH STENT PLACEMENT    . TONSILLECTOMY      Prior to Admission medications   Medication Sig Start Date End Date Taking? Authorizing Provider  ibuprofen (ADVIL,MOTRIN) 800 MG tablet Take 1 tablet (800 mg total) by mouth every 8 (eight) hours as needed for moderate pain. 11/04/17   Joni Reining, PA-C  naproxen (NAPROSYN) 375 MG tablet Take 1 tablet (375 mg total) by mouth 2 (two) times daily with a meal. 05/27/20   Jene Every, MD     Allergies Penicillins  No family history on file.  Social History Social History   Tobacco Use  . Smoking status: Current Every Day Smoker    Packs/day: 1.00  . Smokeless tobacco: Never Used  Vaping Use  . Vaping Use: Never used  Substance Use Topics  . Alcohol use: Not Currently    Comment: Pt states "I drink at least a fifth every other day"  . Drug use: Not Currently    Types: Cocaine, IV    Comment: last used crack 06/06/2019    Review of Systems  Constitutional: No dizziness  ENT: No neck pain Cardiovascular: No chest pain  Gastrointestinal: No abdominal pain.  Musculoskeletal: As  above Skin: Negative for laceration Neurological: Negative for numbness or tingling    ____________________________________________   PHYSICAL EXAM:  VITAL SIGNS: ED Triage Vitals  Enc Vitals Group     BP 05/27/20 1115 126/83     Pulse Rate 05/27/20 1115 80     Resp 05/27/20 1115 16     Temp 05/27/20 1115 97.7 F (36.5 C)     Temp Source 05/27/20 1115 Oral     SpO2 05/27/20 1115 98 %     Weight 05/27/20 1117 65.8 kg (145 lb)     Height 05/27/20 1117 1.651 m (5\' 5" )     Head Circumference --      Peak Flow --      Pain Score 05/27/20 1117 8     Pain Loc --      Pain Edu? --      Excl. in [place]? --      Constitutional: Alert and oriented.   Head: Atraumatic. Nose: No congestion/rhinnorhea. Mouth/Throat: Mucous membranes are moist.   Cardiovascular: Normal rate, regular rhythm.  Clavicle examined, no point tenderness to palpation, no bony normality. Respiratory: Normal respiratory effort.  No retractions.  Musculoskeletal: Left arm full range of motion, some mild discomfort along the anterior left shoulder and distal clavicle Neurologic:  Normal speech and language. No gross focal neurologic deficits are appreciated.   Skin:  Skin is warm, dry and intact. No rash noted.   ____________________________________________   LABS (all labs ordered are listed, but only abnormal results are displayed)  Labs Reviewed  CBC WITH DIFFERENTIAL/PLATELET - Abnormal; Notable for the following components:      Result Value   WBC 11.2 (*)    Neutro Abs 9.6 (*)    All other components within normal limits  BASIC METABOLIC PANEL - Abnormal; Notable for the following components:   Glucose, Bld 105 (*)    All other components within normal limits   ____________________________________________  EKG  ED ECG REPORT I, Jene Every, the attending physician, personally viewed and interpreted this ECG.  Date: 06/15/2020  Rhythm: normal sinus rhythm QRS Axis: normal Intervals:  normal ST/T Wave abnormalities: normal Narrative Interpretation: no evidence of acute ischemia  ____________________________________________  RADIOLOGY  Shoulder x-ray reviewed by me with patient, no fracture ____________________________________________   PROCEDURES  Procedure(s) performed: No  Procedures   Critical Care performed: No ____________________________________________   INITIAL IMPRESSION / ASSESSMENT AND PLAN / ED COURSE  Pertinent labs & imaging results that were available during my care of the patient were reviewed by me and considered in my medical decision making (see chart for details).  Patient presents after fall onto the left shoulder.  Likely contusion versus shoulder sprain versus fracture.  Treated with IM Toradol, x-ray is reassuring, read by radiologist is unremarkable.  We will place patient in sling, treat with NSAIDs,ice  outpatient follow-up with Ortho as needed if no improvement.   ____________________________________________   FINAL CLINICAL IMPRESSION(S) / ED DIAGNOSES  Final diagnoses:  Acute pain of left shoulder  Contusion of left shoulder, initial encounter      NEW MEDICATIONS STARTED DURING THIS VISIT:  New Prescriptions   NAPROXEN (NAPROSYN) 375 MG TABLET    Take 1 tablet (375 mg total) by mouth 2 (two) times daily with a meal.     Note:  This document was prepared using Dragon voice recognition software and may include unintentional dictation errors.   Jene Every, MD 05/27/20 1345    Jene Every, MD 06/15/20 1452

## 2020-05-27 NOTE — ED Triage Notes (Signed)
Pt in via EMS from home with c/o trip and fall last pm and left shoulder pain. Pt fell into a wall.

## 2020-08-03 ENCOUNTER — Emergency Department
Admission: EM | Admit: 2020-08-03 | Discharge: 2020-08-03 | Disposition: A | Discharge status: Home / Self Care | Payer: Self-pay | Attending: Emergency Medicine | Admitting: Emergency Medicine

## 2020-08-03 ENCOUNTER — Other Ambulatory Visit: Payer: Self-pay

## 2020-08-03 DIAGNOSIS — F17210 Nicotine dependence, cigarettes, uncomplicated: Secondary | ICD-10-CM | POA: Insufficient documentation

## 2020-08-03 DIAGNOSIS — I10 Essential (primary) hypertension: Secondary | ICD-10-CM | POA: Insufficient documentation

## 2020-08-03 DIAGNOSIS — T40601A Poisoning by unspecified narcotics, accidental (unintentional), initial encounter: Secondary | ICD-10-CM

## 2020-08-03 DIAGNOSIS — T402X1A Poisoning by other opioids, accidental (unintentional), initial encounter: Secondary | ICD-10-CM | POA: Insufficient documentation

## 2020-08-03 DIAGNOSIS — F1910 Other psychoactive substance abuse, uncomplicated: Secondary | ICD-10-CM | POA: Insufficient documentation

## 2020-08-03 DIAGNOSIS — Z79899 Other long term (current) drug therapy: Secondary | ICD-10-CM | POA: Insufficient documentation

## 2020-08-03 HISTORY — DX: Other psychoactive substance abuse, uncomplicated: F19.10

## 2020-08-03 LAB — COMPREHENSIVE METABOLIC PANEL
ALT: 42 U/L (ref 0–44)
AST: 32 U/L (ref 15–41)
Albumin: 4.2 g/dL (ref 3.5–5.0)
Alkaline Phosphatase: 83 U/L (ref 38–126)
Anion gap: 13 (ref 5–15)
BUN: 15 mg/dL (ref 6–20)
CO2: 27 mmol/L (ref 22–32)
Calcium: 8.6 mg/dL — ABNORMAL LOW (ref 8.9–10.3)
Chloride: 99 mmol/L (ref 98–111)
Creatinine, Ser: 1.23 mg/dL (ref 0.61–1.24)
GFR calc Af Amer: >60 mL/min (ref >60)
GFR calc non Af Amer: >60 mL/min (ref >60)
Glucose, Bld: 316 mg/dL — ABNORMAL HIGH (ref 70–99)
Potassium: 4.7 mmol/L (ref 3.5–5.1)
Sodium: 139 mmol/L (ref 135–145)
Total Bilirubin: 0.5 mg/dL (ref 0.3–1.2)
Total Protein: 7.6 g/dL (ref 6.5–8.1)

## 2020-08-03 LAB — SALICYLATE LEVEL: Salicylate Lvl: <7 mg/dL — ABNORMAL LOW (ref 7.0–30.0)

## 2020-08-03 LAB — CBC
HCT: 45.7 % (ref 39.0–52.0)
Hemoglobin: 15.8 g/dL (ref 13.0–17.0)
MCH: 33 pg (ref 26.0–34.0)
MCHC: 34.6 g/dL (ref 30.0–36.0)
MCV: 95.4 fL (ref 80.0–100.0)
Platelets: 147 10*3/uL — ABNORMAL LOW (ref 150–400)
RBC: 4.79 MIL/uL (ref 4.22–5.81)
RDW: 13.9 % (ref 11.5–15.5)
WBC: 21.5 10*3/uL — ABNORMAL HIGH (ref 4.0–10.5)
nRBC: 0 % (ref 0.0–0.2)

## 2020-08-03 LAB — URINE DRUG SCREEN, QUALITATIVE (ARMC ONLY)
Amphetamines, Ur Screen: NOT DETECTED
Barbiturates, Ur Screen: NOT DETECTED
Benzodiazepine, Ur Scrn: NOT DETECTED
Cannabinoid 50 Ng, Ur ~~LOC~~: POSITIVE — AB
Cocaine Metabolite,Ur ~~LOC~~: POSITIVE — AB
MDMA (Ecstasy)Ur Screen: NOT DETECTED
Methadone Scn, Ur: NOT DETECTED
Opiate, Ur Screen: POSITIVE — AB
Phencyclidine (PCP) Ur S: NOT DETECTED
Tricyclic, Ur Screen: NOT DETECTED

## 2020-08-03 LAB — ETHANOL: Alcohol, Ethyl (B): <10 mg/dL (ref <10)

## 2020-08-03 LAB — ACETAMINOPHEN LEVEL: Acetaminophen (Tylenol), Serum: <10 ug/mL — ABNORMAL LOW (ref 10–30)

## 2020-08-03 MED ORDER — SODIUM CHLORIDE 0.9 % IV BOLUS
1000.0000 mL | Freq: Once | INTRAVENOUS | Status: AC
  Administered 2020-08-03: 1000 mL via INTRAVENOUS

## 2020-08-03 MED ORDER — NALOXONE HCL 4 MG/0.1ML NA LIQD: 1.0000 | Freq: Once | NASAL | Status: DC

## 2020-08-03 NOTE — ED Notes (Signed)
Pt left AMA, without d/c papers or Narcan. MD aware

## 2020-08-03 NOTE — ED Provider Notes (Signed)
[facility] Emergency Department Provider Note  ____________________________________________   First MD Initiated Contact with Patient 08/03/20 0256     (approximate)  I have reviewed the triage vital signs and the nursing notes.   HISTORY  Chief Complaint Drug Overdose    HPI Gary Miranda is a 39 y.o. male with medical history as listed below who presents by EMS after an apparent overdose.  The patient was found unresponsive in a vehicle with minimal respirations.  He received a total of 6 mg of Narcan and 3 separate boluses of 2 mg.  He woke up after the third dose.  He is currently sleepy but says he feels okay.  He said he is not sure what happened.  He reports snorting something that he thinks could have been fentanyl.  He usually uses cocaine.  He remembers being "out of it" and hearing some of what was going on but not being able to respond.  He said he feels better now but was scared by the experience.  He said that it was definitely an accidental overdose and that he would never want to hurt himself or anyone else.  He denies fever/chills, chest pain, shortness of breath, nausea, vomiting, abdominal pain.  The symptoms were acute in onset and severe but he feels better now.  Nothing in particular made it worse and Narcan made it better.        Past Medical History:  Diagnosis Date  . History of cardiac cath   . Polysubstance abuse ([place])     There are no problems to display for this patient.   Past Surgical History:  Procedure Laterality Date  . CHEST TUBE INSERTION     x2  . CORONARY ANGIOPLASTY WITH STENT PLACEMENT    . TONSILLECTOMY      Prior to Admission medications   Medication Sig Start Date End Date Taking? Authorizing Provider  ibuprofen (ADVIL,MOTRIN) 800 MG tablet Take 1 tablet (800 mg total) by mouth every 8 (eight) hours as needed for moderate pain. 11/04/17   Sable Feil, PA-C  naproxen (NAPROSYN) 375 MG tablet Take 1  tablet (375 mg total) by mouth 2 (two) times daily with a meal. 05/27/20   Lavonia Drafts, MD    Allergies Penicillins  History reviewed. No pertinent family history.  Social History Social History   Tobacco Use  . Smoking status: Current Every Day Smoker    Packs/day: 1.00  . Smokeless tobacco: Never Used  Vaping Use  . Vaping Use: Never used  Substance Use Topics  . Alcohol use: Not Currently    Comment: Pt states "I drink at least a fifth every other day"  . Drug use: Yes    Types: Cocaine    Comment: last used crack 06/06/2019    Review of Systems Constitutional: No fever/chills Eyes: No visual changes. ENT: No sore throat. Cardiovascular: Denies chest pain. Respiratory: Denies shortness of breath. Gastrointestinal: No abdominal pain.  No nausea, no vomiting.  No diarrhea.  No constipation. Genitourinary: Negative for dysuria. Musculoskeletal: Negative for neck pain.  Negative for back pain. Integumentary: Negative for rash. Neurological: Negative for headaches, focal weakness or numbness. Psych:  reported accidental overdose  ____________________________________________   PHYSICAL EXAM:  VITAL SIGNS: ED Triage Vitals  Enc Vitals Group     BP 08/03/20 0300 (!) 84/68     Pulse Rate 08/03/20 0300 90     Resp 08/03/20 0300 14     Temp --  Temp src --      SpO2 08/03/20 0300 95 %     Weight 08/03/20 0231 59 kg (130 lb)     Height 08/03/20 0231 1.651 m ('5\' 5"' )     Head Circumference --      Peak Flow --      Pain Score 08/03/20 0230 9     Pain Loc --      Pain Edu? --      Excl. in [place]? --     Constitutional: Alert and oriented.  Disheveled but in no distress. Eyes: Conjunctivae are normal.  Head: Atraumatic. Nose: No congestion/rhinnorhea. Mouth/Throat: Patient is wearing a mask. Neck: No stridor.  No meningeal signs.   Cardiovascular: Normal rate, regular rhythm. Good peripheral circulation. Grossly normal heart sounds.  Mild  hypertension. Respiratory: Normal respiratory effort.  No retractions. Gastrointestinal: Soft and nontender. No distention.  Musculoskeletal: No lower extremity tenderness nor edema. No gross deformities of extremities. Neurologic:  Normal speech and language. No gross focal neurologic deficits are appreciated.  Skin:  Skin is warm, dry and intact. Psychiatric: Mood and affect are subdued and somewhat somnolent but generally appropriate.  Adamantly denies intentional overdose.  Admits to substance abuse but does not want to hurt himself.  ____________________________________________   LABS (all labs ordered are listed, but only abnormal results are displayed)  Labs Reviewed  COMPREHENSIVE METABOLIC PANEL - Abnormal; Notable for the following components:      Result Value   Glucose, Bld 316 (*)    Calcium 8.6 (*)    All other components within normal limits  SALICYLATE LEVEL - Abnormal; Notable for the following components:   Salicylate Lvl <5.9 (*)    All other components within normal limits  ACETAMINOPHEN LEVEL - Abnormal; Notable for the following components:   Acetaminophen (Tylenol), Serum <10 (*)    All other components within normal limits  CBC - Abnormal; Notable for the following components:   WBC 21.5 (*)    Platelets 147 (*)    All other components within normal limits  URINE DRUG SCREEN, QUALITATIVE ([facility] ONLY) - Abnormal; Notable for the following components:   Cocaine Metabolite,Ur [place] POSITIVE (*)    Opiate, Ur Screen POSITIVE (*)    Cannabinoid 50 Ng, Ur [place] POSITIVE (*)    All other components within normal limits  ETHANOL   ____________________________________________  EKG  No indication for EKG ____________________________________________  RADIOLOGY I, Hinda Kehr, personally viewed and evaluated these images (plain radiographs) as part of my medical decision making, as well as reviewing the written report by the radiologist.  ED MD interpretation: No  indication for imaging  Official radiology report(s): No results found.  ____________________________________________   PROCEDURES   Procedure(s) performed (including Critical Care):  Procedures   ____________________________________________   INITIAL IMPRESSION / MDM / [place] / ED COURSE  As part of my medical decision making, I reviewed the following data within the [place] notes reviewed and incorporated, Labs reviewed , Old chart reviewed, Notes from prior ED visits and [place] Controlled Substance Database   Differential diagnosis includes, but is not limited to, overdose, acute infection of any given source, electrolyte or metabolic abnormality.  The patient is awake and alert and somnolent but talking to me.  He is slightly hypotensive but is tolerating oral intake and I ordered a liter of IV fluids.  His labs are notable for an essentially normal comprehensive metabolic panel other than hyperglycemia, negative salicylate  and acetaminophen levels, negative ethanol, and a leukocytosis of 21.5 which I suspect is a stress response to his overdose, recent Narcan administration, etc.  He is not tachycardic.  He has no complaints or concerns right now, just says that he is thirsty and is able to drink.  I will reassess to make sure that he does not decompensate or have another episode of apnea once the Narcan wears off.      Clinical Course as of Aug 03 721  Mon Aug 03, 2020  6837 Patient does not meet criteria for IVC nor inpatient psychiatric treatment.   [CF]  2902 positive for cocaine, opiates, and cannabinoids  Urine Drug Screen, Qualitative(!) [CF]  [id number] Patient awake, alert, ambulatory, clinically sober.  Refusing additional care, wants to leave.  Does not meet criteria for admission nor inpatient treatment.  Discharging with papers and home Narcan kit   [CF]  [phone] Of note, patient refused to wait for my papers or the home Narcan kit    [CF]    Clinical Course User Index [CF] Hinda Kehr, MD     ____________________________________________  FINAL CLINICAL IMPRESSION(S) / ED DIAGNOSES  Final diagnoses:  Polysubstance abuse ([place])  Opiate overdose, accidental or unintentional, initial encounter [facility])     MEDICATIONS GIVEN DURING THIS VISIT:  Medications  naloxone (NARCAN) nasal spray 4 mg/0.1 mL (has no administration in time range)  sodium chloride 0.9 % bolus 1,000 mL (0 mLs Intravenous Stopped 08/03/20 0500)     ED Discharge Orders    None      *Please note:  Greogory Cornette was evaluated in Emergency Department on 08/03/2020 for the symptoms described in the history of present illness. He was evaluated in the context of the global COVID-19 pandemic, which necessitated consideration that the patient might be at risk for infection with the SARS-CoV-2 virus that causes COVID-19. Institutional protocols and algorithms that pertain to the evaluation of patients at risk for COVID-19 are in a state of rapid change based on information released by regulatory bodies including the CDC and federal and state organizations. These policies and algorithms were followed during the patient's care in the ED.  Some ED evaluations and interventions may be delayed as a result of limited staffing during and after the pandemic.*  Note:  This document was prepared using Dragon voice recognition software and may include unintentional dictation errors.   Hinda Kehr, MD 08/03/20 [phone]

## 2020-08-03 NOTE — Discharge Instructions (Signed)
You have been seen in the Emergency Department (ED) today for an accidental opioid overdose.  If you continue using heroin and/or other illegal substances, you will eventually end up dead.  You are the only one who can change your lifestyle. You have been provided with some resources that may help, and if you have any outpatient physicians or therapists, they may also help provide additional resources.  You were also provided with a home Narcan kit.  Keep it with you and let your friends and family know you have one. If you overdose again, they can administer the medication in your nose before calling 911, but you still must come to the Emergency Department because the medication will wear off and you could stop breathing again.  Please take any prescriptions that have been provided as indicated on the instructions.  Please return to the ED immediately if you have ANY thoughts of hurting yourself or anyone else, so that we may help you.  Follow up with your doctor and/or therapist as soon as possible regarding today's ED visit.   Please follow up any other recommendations and clinic appointments provided by the psychiatry team that saw you in the Emergency Department.  Please contact [facility] for additional mental health/psychiatric assistance:  [place], [place] [address] Phone:  [phone] or [phone]  Open Access:   Walk-in ASSESSMENT hours, M-W-F, 8:00am - 3:00pm Advanced Acess CRISIS:  M-F, 8:00am - 8:00pm Outpatient Services Office Hours:  M-F, 8:00am - 5:00pm

## 2020-08-03 NOTE — ED Notes (Signed)
Pt tolerating PO fluids

## 2020-08-03 NOTE — ED Notes (Signed)
Pt given a sandwich tray ?

## 2020-08-03 NOTE — ED Notes (Signed)
Confirmed that IV was pulled out by pt prior to walking out

## 2020-08-03 NOTE — ED Triage Notes (Signed)
Pt found unresponsive in car, received 6 of narcan from EMS, 2 at a time, and revived.  He does not know what he took.  EMS does not know who called them.  Hx of drug overdose and needing narcan.
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
## 2022-05-05 ENCOUNTER — Ambulatory Visit: Payer: Self-pay | Admitting: Family Medicine
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
## 2022-06-21 ENCOUNTER — Ambulatory Visit: Payer: Self-pay | Admitting: Family Medicine

## 2022-08-02 ENCOUNTER — Ambulatory Visit
Admission: EM | Admit: 2022-08-02 | Discharge: 2022-08-02 | Disposition: A | Discharge status: Home / Self Care | Payer: Self-pay | Attending: Family Medicine | Admitting: Family Medicine

## 2022-08-02 DIAGNOSIS — Z5321 Procedure and treatment not carried out due to patient leaving prior to being seen by health care provider: Secondary | ICD-10-CM | POA: Insufficient documentation

## 2022-08-02 DIAGNOSIS — M549 Dorsalgia, unspecified: Secondary | ICD-10-CM | POA: Insufficient documentation

## 2022-08-02 LAB — URINALYSIS, ROUTINE W REFLEX MICROSCOPIC
Glucose, UA: NEGATIVE mg/dL
Hgb urine dipstick: NEGATIVE
Leukocytes,Ua: NEGATIVE
Nitrite: NEGATIVE
Protein, ur: NEGATIVE mg/dL
Specific Gravity, Urine: 1.02 (ref 1.005–1.030)
pH: 6 (ref 5.0–8.0)

## 2022-08-02 NOTE — ED Triage Notes (Signed)
Pt c/o nausea, back pain, urinary pain and penile pain x2days.  Pt has a history of kidney stones.   Pt denies any heavy lifting or working out and states that he woke up with back pain.

## 2022-08-02 NOTE — ED Provider Notes (Signed)
Patient left without seeing provider and urine was collected.   Katha Cabal, DO    [place], Seward Meth, DO 08/02/22 1723

## 2022-08-18 ENCOUNTER — Ambulatory Visit: Payer: Self-pay | Admitting: Family Medicine
